# Patient Record
Sex: Female | Born: 1995 | Race: White | Hispanic: No | Marital: Single | Smoking: Former smoker
Health system: Southern US, Community
[De-identification: ages and names within clinical notes are randomized; demographics above are authoritative.]

## PROBLEM LIST (undated history)

## (undated) DIAGNOSIS — Z789 Other specified health status: Secondary | ICD-10-CM

## (undated) HISTORY — DX: Other specified health status: Z78.9

---
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
## 2013-04-06 ENCOUNTER — Encounter: Payer: Self-pay | Admitting: Family Medicine

## 2013-04-06 ENCOUNTER — Ambulatory Visit (INDEPENDENT_AMBULATORY_CARE_PROVIDER_SITE_OTHER): Payer: BC Managed Care – PPO

## 2013-04-06 ENCOUNTER — Ambulatory Visit (INDEPENDENT_AMBULATORY_CARE_PROVIDER_SITE_OTHER): Payer: BC Managed Care – PPO | Admitting: Family Medicine

## 2013-04-06 VITALS — BP 115/71 | HR 85 | Ht 64.0 in | Wt 132.0 lb

## 2013-04-06 DIAGNOSIS — M25561 Pain in right knee: Secondary | ICD-10-CM

## 2013-04-06 DIAGNOSIS — M25569 Pain in unspecified knee: Secondary | ICD-10-CM

## 2013-04-06 DIAGNOSIS — W880XXA Exposure to X-rays, initial encounter: Secondary | ICD-10-CM

## 2013-04-06 MED ORDER — MELOXICAM 15 MG PO TABS: 15.0000 mg | ORAL_TABLET | Freq: Every day | ORAL | Status: DC
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
## 2013-04-06 NOTE — Patient Instructions (Addendum)
We will call you tomorrow about your xray results.  Start meloxicam today and take daily.  Hold onto the exercise regimen we gave you today.

## 2013-04-07 NOTE — Addendum Note (Signed)
Addended by: Avon Gully C on: 04/07/2013 12:04 PM   Modules accepted: Orders

## 2013-04-26 ENCOUNTER — Encounter: Payer: Self-pay | Admitting: Physician Assistant

## 2013-04-26 ENCOUNTER — Ambulatory Visit (INDEPENDENT_AMBULATORY_CARE_PROVIDER_SITE_OTHER): Payer: BC Managed Care – PPO | Admitting: Physician Assistant

## 2013-04-26 VITALS — BP 117/74 | HR 97 | Wt 131.0 lb

## 2013-04-26 DIAGNOSIS — M222X1 Patellofemoral disorders, right knee: Secondary | ICD-10-CM

## 2013-04-26 DIAGNOSIS — R6882 Decreased libido: Secondary | ICD-10-CM

## 2013-04-26 DIAGNOSIS — M25569 Pain in unspecified knee: Secondary | ICD-10-CM

## 2013-04-26 DIAGNOSIS — M25561 Pain in right knee: Secondary | ICD-10-CM | POA: Insufficient documentation

## 2013-04-26 MED ORDER — MELOXICAM 15 MG PO TABS: 15.0000 mg | ORAL_TABLET | Freq: Every day | ORAL | Status: DC

## 2013-04-26 MED ORDER — TRAMADOL HCL 50 MG PO TABS: 50.0000 mg | ORAL_TABLET | Freq: Four times a day (QID) | ORAL | Status: DC | PRN

## 2013-04-26 NOTE — Patient Instructions (Addendum)
Patellofemoral Syndrome  If you have had pain in the front of your knee for a long time, chances are good that you have patellofemoral syndrome. The word patella refers to the kneecap. Femoral (or femur) refers to the thigh bone. That is the bone the kneecap sits on. The kneecap is shaped like a triangle. Its job is to protect the knee and to improve the efficiency of your thigh muscles (quadriceps). The underside of the kneecap is made of smooth tissue (cartilage). This lets the kneecap slide up and down as the knee moves. Sometimes this cartilage becomes soft. Your healthcare provider may say the cartilage breaks down. That is patellofemoral syndrome. It can affect one knee, or both. The condition is sometimes called patellofemoral pain syndrome. That is because the condition is painful. The pain usually gets worse with activity. Sitting for a long time with the knee bent also makes the pain worse. It usually gets better with rest and proper treatment.  CAUSES   No one is sure why some people develop this problem and others do not. Runners often get it. One name for the condition is "runner's knee." However, some people run for years and never have knee pain. Certain things seem to make patellofemoral syndrome more likely. They include:  · Moving out of alignment. The kneecap is supposed to move in a straight line when the thigh muscle pulls on it. Sometimes the kneecap moves in poor alignment. That can make the knee swell and hurt. Some experts believe it also wears down the cartilage.  · Injury to the kneecap.  · Strain on the knee. This may occur during sports activity. Soccer, running, skiing and cycling can put excess stress on the knee.  · Being flat-footed or knock-kneed.  SYMPTOMS   · Knee pain.  · Pain under the kneecap. This is usually a dull, aching pain.  · Pain in the knee when doing certain things: squatting, kneeling, going up or down stairs.  · Pain in the knee when you stand up after sitting down  for awhile.  · Tightness in the knee.  · Loss of muscle strength in the thigh.  · Swelling of the knee.  DIAGNOSIS   Healthcare providers often send people with knee pain to an orthopedic caregiver. This person has special training to treat problems with bones and joints. To decide what is causing your knee pain, your caregiver will probably:  · Do a physical exam. This will probably include:  · Asking about symptoms you have noticed.  · Asking about your activities and any injuries.  · Feeling your knee. Moving it. This will help test the knee's strength. It will also check alignment (whether the knee and leg are aligned normally).  · Order some tests, such as:  · Imaging tests. They create pictures of the inside of the knee. Tests may include:  · X-rays.  · Computed tomography (CT) scan. This uses X-rays and a computer to show more detail.  · Magnetic resonance imaging (MRI). This test uses magnets, radio waves and a computer to make pictures.  TREATMENT   · Medication is almost always used first. It can relieve pain. It also can reduce swelling. Non-steroidal anti-inflammatory medicines (called NSAIDs) are usually suggested. Sometimes a stronger form is needed. A stronger form would require a prescription.  · Other treatment may be needed after the swelling goes down. Possibilities include:  · Exercise. Certain exercises can make the muscles around the knee stronger which decreases the   pressure on the knee cap. This includes the thigh muscle. Certain exercises also may be suggested to increase your flexibility.  · A knee brace. This gives the knee extra support and helps align the movement of the knee cap.  · Orthotics. These are special shoe inserts. They can help keep your leg and knee aligned.  · Surgery is sometimes needed. This is rare. Options include:  · Arthroscopy. The surgeon uses a special tool to remove any damaged pieces of the kneecap. Only a few small incisions (cuts) are needed.  · Realignment.  This is open surgery. The goals are to reduce pressure and fix the way the kneecap moves.  HOME CARE INSTRUCTIONS   · Take any medication prescribed by your healthcare provider. Follow the directions carefully.  · If your knee is swollen:  · Put ice or cold packs on it. Do this for 20 to 30 minutes, 3 to 4 times a day.  · Keep the knee raised. Make sure it is supported. Put a pillow under it.  · Rest your knee. For example, take the elevator instead of the stairs for awhile. Or, take a break from sports activity that strain your knee. Try walking or swimming instead.  · Whenever you are active:  · Use an elastic bandage on your knee. This gives it support.  · After any activity, put ice or cold packs on your knees. Do this for about 10 to 20 minutes.  · Make sure you wear shoes that give good support. Make sure they are not worn down. The heels should not slant in or out.  SEEK MEDICAL CARE IF:   · Knee pain gets worse. Or it does not go away, even after taking pain medicine.  · Swelling does not go down.  · Your thigh muscle becomes weak.  · You have an oral temperature above 102° F (38.9° C).  SEEK IMMEDIATE MEDICAL CARE IF:   You have an oral temperature above 102° F (38.9° C), not controlled by medicine.  Document Released: 05/20/2009 Document Revised: 08/24/2011 Document Reviewed: 05/20/2009  ExitCare® Patient Information ©2014 [employer].

## 2013-04-26 NOTE — Progress Notes (Signed)
  Subjective:    Patient ID: Meghan Moreno, female    DOB: January 08, 1996, 17 y.o.   MRN: [id number]  HPI Patient presents to the clinic with ongoing bilateral knee pain. She has had knee pain for many years but had become much worse over the past 4 weeks. Pain is only when walking or running for extended periods of time. Worst when going up and down stairs. Pain is 9/10 when running. Best if seated or walking for short distances. Pain is localized in the anterior knee. Xrays were done by Dr. Ivan Anchors and were negative. Mobic was given and does not seem to really help. Tramadol helps some.    Pt's boyfriend is concerned with her libido. He wants to have sex every day and she only wants to have sex a couple times a week. She is still able to orgasm. She admits to a lot of school work and stress. She tries to exercise but her knee hurt a lot.    Review of Systems     Objective:   Physical Exam  Constitutional: She is oriented to person, place, and time. She appears well-developed and well-nourished.  HENT:  Head: Normocephalic and atraumatic.  Cardiovascular: Normal rate, regular rhythm and normal heart sounds.   Pulmonary/Chest: Effort normal and breath sounds normal.  Musculoskeletal:  Normal ROM of bilateral knees. No joint tenderness to palpation. No effusion or swelling. Strength 5/5.   Neurological: She is alert and oriented to person, place, and time. She has normal reflexes.  Skin: Skin is warm and dry.  Psychiatric: She has a normal mood and affect. Her behavior is normal.          Assessment & Plan:  Bilateral knee pain- symptoms seem consistent with patellofemoral syndrome. I am sending her to Dr. Karie Schwalbe for evaluation and possible orthotics. Xrays normal. Could consider MRI if not improving. Continue to take mobic and as needed tramadol.   Loss of libido- discussed with patient I feel her libido is normal but perhaps she needs to have a discussion with her boyfriend about  expectations. As long as she continues to orgasm and want sex weekly she is fine. She is not on any medications that could effect. Stress and anxiety could affect libido. Consider exercise to help decrease stress and increase libido.    Spent 30 minutes with patient and greater than 50 percent of visit spent counseling patient regarding libido.

## 2013-04-28 DIAGNOSIS — M222X1 Patellofemoral disorders, right knee: Secondary | ICD-10-CM | POA: Insufficient documentation

## 2013-04-28 MED ORDER — BECLOMETHASONE DIPROPIONATE 40 MCG/ACT IN AERS: 2.0000 | INHALATION_SPRAY | Freq: Two times a day (BID) | RESPIRATORY_TRACT | Status: DC

## 2013-04-28 MED ORDER — PREDNISONE 50 MG PO TABS: ORAL_TABLET | ORAL | Status: DC

## 2013-07-19 ENCOUNTER — Encounter: Payer: Self-pay | Admitting: Family Medicine

## 2013-07-19 ENCOUNTER — Ambulatory Visit (INDEPENDENT_AMBULATORY_CARE_PROVIDER_SITE_OTHER): Payer: BC Managed Care – PPO | Admitting: Family Medicine

## 2013-07-19 VITALS — BP 114/72 | HR 104 | Temp 98.2°F | Wt 125.0 lb

## 2013-07-19 DIAGNOSIS — A088 Other specified intestinal infections: Secondary | ICD-10-CM

## 2013-07-19 DIAGNOSIS — A084 Viral intestinal infection, unspecified: Secondary | ICD-10-CM

## 2013-07-19 MED ORDER — ONDANSETRON HCL 4 MG PO TABS: 4.0000 mg | ORAL_TABLET | Freq: Three times a day (TID) | ORAL | Status: DC | PRN

## 2013-07-19 MED ORDER — PROMETHAZINE HCL 50 MG PO TABS: 50.0000 mg | ORAL_TABLET | Freq: Four times a day (QID) | ORAL | Status: DC | PRN

## 2013-07-19 NOTE — Patient Instructions (Signed)
Diet for Diarrhea, Adult  Frequent, runny stools (diarrhea) may be caused or worsened by food or drink. Diarrhea may be relieved by changing your diet. Since diarrhea can last up to 7 days, it is easy for you to lose too much fluid from the body and become dehydrated. Fluids that are lost need to be replaced. Along with a modified diet, make sure you drink enough fluids to keep your urine clear or pale yellow.  DIET INSTRUCTIONS  · Ensure adequate fluid intake (hydration): have 1 cup (8 oz) of fluid for each diarrhea episode. Avoid fluids that contain simple sugars or sports drinks, fruit juices, whole milk products, and sodas. Your urine should be clear or pale yellow if you are drinking enough fluids. Hydrate with an oral rehydration solution that you can purchase at pharmacies, retail stores, and online. You can prepare an oral rehydration solution at home by mixing the following ingredients together:  ·   tsp table salt.  · ¾ tsp baking soda.  ·  tsp salt substitute containing potassium chloride.  · 1  tablespoons sugar.  · 1 L (34 oz) of water.  · Certain foods and beverages may increase the speed at which food moves through the gastrointestinal (GI) tract. These foods and beverages should be avoided and include:  · Caffeinated and alcoholic beverages.  · High-fiber foods, such as raw fruits and vegetables, nuts, seeds, and whole grain breads and cereals.  · Foods and beverages sweetened with sugar alcohols, such as xylitol, sorbitol, and mannitol.  · Some foods may be well tolerated and may help thicken stool including:  · Starchy foods, such as rice, toast, pasta, low-sugar cereal, oatmeal, grits, baked potatoes, crackers, and bagels.    · Bananas.    · Applesauce.  · Add probiotic-rich foods to help increase healthy bacteria in the GI tract, such as yogurt and fermented milk products.  RECOMMENDED FOODS AND BEVERAGES  Starches  Choose foods with less than 2 g of fiber per serving.  · Recommended:  White,  French, and pita breads, plain rolls, buns, bagels. Plain muffins, matzo. Soda, saltine, or graham crackers. Pretzels, melba toast, zwieback. Cooked cereals made with water: cornmeal, farina, cream cereals. Dry cereals: refined corn, wheat, rice. Potatoes prepared any way without skins, refined macaroni, spaghetti, noodles, refined rice.  · Avoid:  Bread, rolls, or crackers made with whole wheat, multi-grains, rye, bran seeds, nuts, or coconut. Corn tortillas or taco shells. Cereals containing whole grains, multi-grains, bran, coconut, nuts, raisins. Cooked or dry oatmeal. Coarse wheat cereals, granola. Cereals advertised as "high-fiber." Potato skins. Whole grain pasta, wild or brown rice. Popcorn. Sweet potatoes, yams. Sweet rolls, doughnuts, waffles, pancakes, sweet breads.  Vegetables  · Recommended: Strained tomato and vegetable juices. Most well-cooked and canned vegetables without seeds. Fresh: Tender lettuce, cucumber without the skin, cabbage, spinach, bean sprouts.  · Avoid: Fresh, cooked, or canned: Artichokes, baked beans, beet greens, broccoli, Brussels sprouts, corn, kale, legumes, peas, sweet potatoes. Cooked: Green or red cabbage, spinach. Avoid large servings of any vegetables because vegetables shrink when cooked, and they contain more fiber per serving than fresh vegetables.  Fruit  · Recommended: Cooked or canned: Apricots, applesauce, cantaloupe, cherries, fruit cocktail, grapefruit, grapes, kiwi, mandarin oranges, peaches, pears, plums, watermelon. Fresh: Apples without skin, ripe banana, grapes, cantaloupe, cherries, grapefruit, peaches, oranges, plums. Keep servings limited to ½ cup or 1 piece.  · Avoid: Fresh: Apples with skin, apricots, mangoes, pears, raspberries, strawberries. Prune juice, stewed or dried prunes. Dried   fruits, raisins, dates. Large servings of all fresh fruits.  Protein  · Recommended: Ground or well-cooked tender beef, ham, veal, lamb, pork, or poultry. Eggs. Fish,  oysters, shrimp, lobster, other seafoods. Liver, organ meats.  · Avoid: Tough, fibrous meats with gristle. Peanut butter, smooth or chunky. Cheese, nuts, seeds, legumes, dried peas, beans, lentils.  Dairy  · Recommended: Yogurt, lactose-free milk, kefir, drinkable yogurt, buttermilk, soy milk, or plain hard cheese.  · Avoid: Milk, chocolate milk, beverages made with milk, such as milkshakes.  Soups  · Recommended: Bouillon, broth, or soups made from allowed foods. Any strained soup.  · Avoid: Soups made from vegetables that are not allowed, cream or milk-based soups.  Desserts and Sweets  · Recommended: Sugar-free gelatin, sugar-free frozen ice pops made without sugar alcohol.  · Avoid: Plain cakes and cookies, pie made with fruit, pudding, custard, cream pie. Gelatin, fruit, ice, sherbet, frozen ice pops. Ice cream, ice milk without nuts. Plain hard candy, honey, jelly, molasses, syrup, sugar, chocolate syrup, gumdrops, marshmallows.  Fats and Oils  · Recommended: Limit fats to less than 8 tsp per day.  · Avoid: Seeds, nuts, olives, avocados. Margarine, butter, cream, mayonnaise, salad oils, plain salad dressings. Plain gravy, crisp bacon without rind.  Beverages  · Recommended: Water, decaffeinated teas, oral rehydration solutions, sugar-free beverages not sweetened with sugar alcohols.  · Avoid: Fruit juices, caffeinated beverages (coffee, tea, soda), alcohol, sports drinks, or lemon-lime soda.  Condiments  · Recommended: Ketchup, mustard, horseradish, vinegar, cocoa powder. Spices in moderation: allspice, basil, bay leaves, celery powder or leaves, cinnamon, cumin powder, curry powder, ginger, mace, marjoram, onion or garlic powder, oregano, paprika, parsley flakes, ground pepper, rosemary, sage, savory, tarragon, thyme, turmeric.  · Avoid: Coconut, honey.  Document Released: 08/22/2003 Document Revised: 02/24/2012 Document Reviewed: 10/16/2011  [employer]® Patient Information ©2014 [employer].

## 2013-07-19 NOTE — Progress Notes (Signed)
CC: Meghan Moreno is a 18 y.o. female is here for Emesis and Diarrhea   Subjective: HPI:  Complaints of nausea, vomiting, diarrhea that began a little over 24 hours ago at 4 AM. Start with nausea and loose stools progressed within 45 hours to include nausea of nonbloody non-coffee ground emesis. She's unsure how may passages she had yesterday but believes that she has vomited at least 5 times and had 4 bowel movements since she woke this morning. Symptoms are worse when trying to ingest foods, this is included Gatorade and crackers. She reports fatigue. She reports her stomach feels unsettled but denies pain in the abdomen specifically. Her unsettled feeling does not radiate. Symptoms are present all hours of the day. Nothing has made it better although she has tried Pepto-Bismol.  Denies fevers, chills, genitourinary complaints, chest pain, shortness of breath, rashes, motor sensory disturbances  Fianc had identical symptoms to a mild degree earlier this week now resolved   Review Of Systems Outlined In HPI  History reviewed. No pertinent past medical history.  History reviewed. No pertinent past surgical history. Family History  Problem Relation Age of Onset  . Leukemia    . Diabetes    . Stroke      grandparent    History   Social History  . Marital Status: Single    Spouse Name: N/A    Number of Children: N/A  . Years of Education: N/A   Occupational History  . Not on file.   Social History Main Topics  . Smoking status: Never Smoker   . Smokeless tobacco: Not on file  . Alcohol Use: No  . Drug Use: Not on file  . Sexual Activity: Yes    Birth Control/ Protection: None   Other Topics Concern  . Not on file   Social History Narrative  . No narrative on file     Objective: BP 114/72  Pulse 104  Temp(Src) 98.2 F (36.8 C) (Oral)  Wt 125 lb (56.7 kg)  General: Alert and Oriented, No Acute Distress HEENT: Pupils equal, round, reactive to light.  Conjunctivae clear.  External ears unremarkable, canals clear with intact TMs with appropriate landmarks.  Middle ear appears open without effusion. Pink inferior turbinates.  Moist mucous membranes, pharynx without inflammation nor lesions.  Neck supple without palpable lymphadenopathy nor abnormal masses. Lungs: Clear to auscultation bilaterally, no wheezing/ronchi/rales.  Comfortable work of breathing. Good air movement. Cardiac: Regular rate and rhythm. Normal S1/S2.  No murmurs, rubs, nor gallops.   Abdomen: Normal bowel sounds, mild discomfort in the left and right lower quadrants with deep palpation however there is no rebound, rigidity, nor palpable masses. No guarding. Extremities: No peripheral edema.  Strong peripheral pulses.  Mental Status: No depression, anxiety, nor agitation. Skin: Warm and dry.  Assessment & Plan: Treana was seen today for emesis and diarrhea.  Diagnoses and associated orders for this visit:  Viral gastroenteritis  Other Orders - ondansetron (ZOFRAN) 4 MG tablet; Take 1-2 tablets (4-8 mg total) by mouth every 8 (eight) hours as needed for nausea or vomiting. - promethazine (PHENERGAN) 50 MG tablet; Take 1 tablet (50 mg total) by mouth every 6 (six) hours as needed for nausea or vomiting.    High suspicion for viral gastroenteritis she was given 8 mg of Zofran today in the office, if this helps nausea within the next half an hour then fill prescription for Zofran otherwise only fill prescription for promethazine.Signs and symptoms requring emergent/urgent reevaluation were  discussed with the patient. Focus of breath diet starting with clear liquids today and tomorrow  Return if symptoms worsen or fail to improve.

## 2013-07-27 ENCOUNTER — Ambulatory Visit (INDEPENDENT_AMBULATORY_CARE_PROVIDER_SITE_OTHER): Payer: BC Managed Care – PPO | Admitting: Family Medicine

## 2013-07-27 ENCOUNTER — Encounter: Payer: Self-pay | Admitting: Family Medicine

## 2013-07-27 ENCOUNTER — Telehealth: Payer: Self-pay | Admitting: *Deleted

## 2013-07-27 VITALS — BP 114/76 | HR 100 | Temp 98.1°F | Wt 129.0 lb

## 2013-07-27 DIAGNOSIS — J029 Acute pharyngitis, unspecified: Secondary | ICD-10-CM

## 2013-07-27 DIAGNOSIS — J020 Streptococcal pharyngitis: Secondary | ICD-10-CM

## 2013-07-27 LAB — POCT RAPID STREP A (OFFICE): RAPID STREP A SCREEN: NEGATIVE

## 2013-07-27 MED ORDER — PENICILLIN V POTASSIUM 500 MG PO TABS: ORAL_TABLET | ORAL | Status: AC

## 2013-07-27 MED ORDER — LIDOCAINE VISCOUS 2 % MT SOLN: 20.0000 mL | OROMUCOSAL | Status: DC | PRN

## 2013-07-27 NOTE — Telephone Encounter (Signed)
Pt called and asked for a letter for work on sat because pharm will not have her rx until tomorrow. Letter written and place up front.pt notified

## 2013-07-27 NOTE — Progress Notes (Signed)
CC: Meghan Moreno is a 18 y.o. female is here for Sore Throat   Subjective: HPI:  Complains of sore throat for the past 24 hours accompanied by fatigue without any other symptoms. Pain is worse with swallowing described only as soreness mild to moderate severity localized in the back of the throat nonradiating. Slightly improved with honey, no other interventions. Nothing else makes better or worse. Has been persistent since onset. Denies cough, wheezing, shortness of breath, difficulty swallowing, rash.   Review Of Systems Outlined In HPI  No past medical history on file.  No past surgical history on file. Family History  Problem Relation Age of Onset  . Leukemia    . Diabetes    . Stroke      grandparent    History   Social History  . Marital Status: Single    Spouse Name: N/A    Number of Children: N/A  . Years of Education: N/A   Occupational History  . Not on file.   Social History Main Topics  . Smoking status: Never Smoker   . Smokeless tobacco: Not on file  . Alcohol Use: No  . Drug Use: Not on file  . Sexual Activity: Yes    Birth Control/ Protection: None   Other Topics Concern  . Not on file   Social History Narrative  . No narrative on file     Objective: BP 114/76  Pulse 100  Temp(Src) 98.1 F (36.7 C) (Oral)  Wt 129 lb (58.514 kg)  General: Alert and Oriented, No Acute Distress HEENT: Pupils equal, round, reactive to light. Conjunctivae clear.  External ears unremarkable, canals clear with intact TMs with appropriate landmarks.  Middle ear appears open without effusion. Pink inferior turbinates.  Moist mucous membranes, midline uvula with moderate pharyngeal erythema and mild bilateral tonsillar exudates.  Neck supple without palpable lymphadenopathy nor abnormal masses. Lungs: Clear to auscultation bilaterally, no wheezing/ronchi/rales.  Comfortable work of breathing. Good air movement. Mental Status: No depression, anxiety, nor  agitation. Skin: Warm and dry.  Assessment & Plan: [name] was seen today for sore throat.  Diagnoses and associated orders for this visit:  Acute pharyngitis - POCT rapid strep A  Strep pharyngitis - penicillin v potassium (VEETID) 500 MG tablet; One by mouth every 12 hours for ten days, take 1 hour before or 2 hours after meals. - lidocaine (XYLOCAINE) 2 % solution; Use as directed 20 mLs in the mouth or throat as needed for mouth pain. Gargle 10-20 seconds and spit or swallow every 4 hours as needed    Strep pharyngitis: Start penicillin and as needed lidocaine encouraged to use ibuprofen 800 mg every 8 hours as needed for pain   Return if symptoms worsen or fail to improve.

## 2013-09-01 ENCOUNTER — Encounter: Payer: Self-pay | Admitting: Family Medicine

## 2013-09-01 ENCOUNTER — Ambulatory Visit (INDEPENDENT_AMBULATORY_CARE_PROVIDER_SITE_OTHER): Payer: BC Managed Care – PPO | Admitting: Family Medicine

## 2013-09-01 VITALS — BP 106/58 | HR 82 | Temp 98.2°F | Wt 130.0 lb

## 2013-09-01 DIAGNOSIS — H6690 Otitis media, unspecified, unspecified ear: Secondary | ICD-10-CM

## 2013-09-01 MED ORDER — DOXYCYCLINE HYCLATE 100 MG PO TABS
ORAL_TABLET | ORAL | Status: AC
Start: 2013-09-01 — End: 2013-09-11

## 2013-09-01 NOTE — Progress Notes (Signed)
CC: Meghan Moreno is a 18 y.o. female is here for left ear sounds muffled   Subjective: HPI:  Complains of decreased hearing in left ear described as muffled, mild in severity, present for exactly one week. Came on abruptly has not been getting better or worse since onset.  Has not been accompanied by any other symptoms. Interventions have included over-the-counter ear drops which have not helped. Symptoms are present all hours of the day. Denies fevers, chills, pain, drainage from ear, nasal congestion, sore throat, nor any other respiratory complaints. Denies pain, tinnitus, nor dizziness   Review Of Systems Outlined In HPI  No past medical history on file.  No past surgical history on file. Family History  Problem Relation Age of Onset  . Leukemia    . Diabetes    . Stroke      grandparent    History   Social History  . Marital Status: Single    Spouse Name: N/A    Number of Children: N/A  . Years of Education: N/A   Occupational History  . Not on file.   Social History Main Topics  . Smoking status: Never Smoker   . Smokeless tobacco: Not on file  . Alcohol Use: No  . Drug Use: Not on file  . Sexual Activity: Yes    Birth Control/ Protection: None   Other Topics Concern  . Not on file   Social History Narrative  . No narrative on file     Objective: BP 106/58  Pulse 82  Temp(Src) 98.2 F (36.8 C) (Oral)  Wt 130 lb (58.968 kg)  General: Alert and Oriented, No Acute Distress HEENT: Pupils equal, round, reactive to light. Conjunctivae clear.  External ears unremarkable, canals clear with intact TMs with appropriate landmarks.  Right Middle ear appears open without effusion left middle ear with purulent effusion. Pink inferior turbinates.  Moist mucous membranes, pharynx without inflammation nor lesions.  Neck supple without palpable lymphadenopathy nor abnormal masses. Neuro: Cranial nerves II through XII grossly intact Mental Status: No depression,  anxiety, nor agitation. Skin: Warm and dry.  Tympanogram is flat, Rinne test is normal weber shows no lateralization  Bedside audiometry shows hearing loss to 40 dB across all 4 frequencies  Assessment & Plan: Meghan Moreno was seen today for left ear sounds muffled.  Diagnoses and associated orders for this visit:  Otitis media  Other Orders - doxycycline (VIBRA-TABS) 100 MG tablet; One by mouth twice a day for ten days.    Start doxycycline if hearing is not improved by Wednesday call and I will refer for formal audiology,   Return if symptoms worsen or fail to improve.

## 2013-10-27 ENCOUNTER — Ambulatory Visit (INDEPENDENT_AMBULATORY_CARE_PROVIDER_SITE_OTHER): Payer: BC Managed Care – PPO | Admitting: Family Medicine

## 2013-10-27 ENCOUNTER — Encounter: Payer: Self-pay | Admitting: Family Medicine

## 2013-10-27 VITALS — BP 111/72 | HR 88 | Temp 98.9°F | Wt 130.0 lb

## 2013-10-27 DIAGNOSIS — J029 Acute pharyngitis, unspecified: Secondary | ICD-10-CM

## 2013-10-27 MED ORDER — AMOXICILLIN 500 MG PO CAPS: 500.0000 mg | ORAL_CAPSULE | Freq: Three times a day (TID) | ORAL | Status: DC

## 2013-10-27 NOTE — Progress Notes (Signed)
CC: Meghan Moreno is a 18 y.o. female is here for Nasal Congestion   Subjective: HPI:  Patient complains of nasal congestion sore throat that has been present for the past week on a daily basis mild to moderate severity, present all hours of the day. Pain is localized low in the throat present only when swallowing. Symptoms are improved with Mucinex however only mildly. No other interventions. Have not been getting better or worse overall since onset. Denies fevers, chills, facial pain, difficulty swallowing, choking, productive cough, shortness of breath, wheezing, nor swollen lymph nodes   Review Of Systems Outlined In HPI  No past medical history on file.  No past surgical history on file. Family History  Problem Relation Age of Onset  . Leukemia    . Diabetes    . Stroke      grandparent    History   Social History  . Marital Status: Single    Spouse Name: N/A    Number of Children: N/A  . Years of Education: N/A   Occupational History  . Not on file.   Social History Main Topics  . Smoking status: Never Smoker   . Smokeless tobacco: Not on file  . Alcohol Use: No  . Drug Use: Not on file  . Sexual Activity: Yes    Birth Control/ Protection: None   Other Topics Concern  . Not on file   Social History Narrative  . No narrative on file     Objective: BP 111/72  Pulse 88  Temp(Src) 98.9 F (37.2 C) (Oral)  Wt 130 lb (58.968 kg)  General: Alert and Oriented, No Acute Distress HEENT: Pupils equal, round, reactive to light. Conjunctivae clear.  External ears unremarkable, canals clear with intact TMs with appropriate landmarks.  Middle ear appears open without effusion. Pink inferior turbinates.  Moist mucous membranes, pharynx without inflammation nor lesions.  Neck supple without palpable lymphadenopathy nor abnormal masses. Lungs: Clear to auscultation bilaterally, no wheezing/ronchi/rales.  Comfortable work of breathing. Good air movement. Extremities:  No peripheral edema.  Strong peripheral pulses.  Mental Status: No depression, anxiety, nor agitation. Skin: Warm and dry.  Assessment & Plan: Meghan PuntMiranda was seen today for nasal congestion.  Diagnoses and associated orders for this visit:  Sore throat - amoxicillin (AMOXIL) 500 MG capsule; Take 1 capsule (500 mg total) by mouth 3 (three) times daily.    Discussed with patient this is most likely due to seasonal allergies, start Qnasl sample that was provided with instructions of using 2 sprays each nostril on a daily basis. If no improvement after the weekend start amoxicillin for next etiology of the differential being bacterial sinusitis  Return if symptoms worsen or fail to improve.

## 2014-03-12 ENCOUNTER — Other Ambulatory Visit: Payer: Self-pay | Admitting: Physician Assistant

## 2014-03-12 DIAGNOSIS — Z319 Encounter for procreative management, unspecified: Secondary | ICD-10-CM

## 2014-04-17 ENCOUNTER — Ambulatory Visit (INDEPENDENT_AMBULATORY_CARE_PROVIDER_SITE_OTHER): Payer: BC Managed Care – PPO | Admitting: Family Medicine

## 2014-04-17 ENCOUNTER — Encounter: Payer: Self-pay | Admitting: Family Medicine

## 2014-04-17 VITALS — BP 120/63 | HR 99 | Wt 137.0 lb

## 2014-04-17 DIAGNOSIS — R079 Chest pain, unspecified: Secondary | ICD-10-CM | POA: Diagnosis not present

## 2014-04-17 DIAGNOSIS — G4700 Insomnia, unspecified: Secondary | ICD-10-CM | POA: Diagnosis not present

## 2014-04-17 MED ORDER — TRAZODONE HCL 150 MG PO TABS: 75.0000 mg | ORAL_TABLET | Freq: Every day | ORAL | Status: DC

## 2014-04-17 NOTE — Progress Notes (Signed)
CC: Meghan Moreno is a 18 y.o. female is here for Chest Pain   Subjective: HPI:  Complains of chest pain localized to the left sternum and also on the left lower rib cage. Symptoms have been present about 3 times a week for the past 1-2 months. Symptoms are not reproducible or predictable. They can occur anytime of the day and come on without warning and will go away without any particular intervention. They will last anywhere from 5 minutes-1 hour. Nothing seems to make it better or worse. They've not changed in character frequency or severity since their onset. She denies any recent or remote trauma/overexertion.  There is no exertional component to her pain she denies any new shortness of breath or cough. There've been no fevers, chills nor wheezing.  She complains of difficult falling asleep over the past 2 years that has now become a moderate in severity on a daily basis. She goes to bed at 10:00 every night but will not be able tosleep until 2 hours after going to bed. Interventions have included diphenhydramine and melatonin without much benefit from either. She denies caffeine intake after 12 noon nor any medications other than above. She denies anxiety, pain or any mental disturbance at keeping her awake at night   Review Of Systems Outlined In HPI  No past medical history on file.  No past surgical history on file. Family History  Problem Relation Age of Onset  . Leukemia    . Diabetes    . Stroke      grandparent    History   Social History  . Marital Status: Single    Spouse Name: N/A    Number of Children: N/A  . Years of Education: N/A   Occupational History  . Not on file.   Social History Main Topics  . Smoking status: Never Smoker   . Smokeless tobacco: Not on file  . Alcohol Use: No  . Drug Use: Not on file  . Sexual Activity: Yes    Birth Control/ Protection: None   Other Topics Concern  . Not on file   Social History Narrative      Objective: BP 120/63 mmHg  Pulse 99  Wt 137 lb (62.143 kg)  General: Alert and Oriented, No Acute Distress HEENT: Pupils equal, round, reactive to light. Conjunctivae clear.  Moist mucous membranes pharynxunremarkable Lungs: Clear to auscultation bilaterally, no wheezing/ronchi/rales.  Comfortable work of breathing. Good air movement. Cardiac: Regular rate and rhythm. Normal S1/S2.  No murmurs, rubs, nor gallops.   Extremities: No peripheral edema.  Strong peripheral pulses.  Mental Status: No depression, anxiety, nor agitation. Skin: Warm and dry.  Assessment & Plan: Meghan PuntMiranda was seen today for chest pain.  Diagnoses and associated orders for this visit:  Insomnia - traZODone (DESYREL) 150 MG tablet; Take 0.5-1 tablets (75-150 mg total) by mouth at bedtime.  Chest pain, unspecified chest pain type - EKG 12-Lead    Insomnia: Start trazodone Chest pain: EKG was obtained showing normal sinus rhythm with mild PR interval shortening, no pathologic Q waves, no ST segment elevation or depression, no other conduction abnormality other than that described above. Reassurance was provided this sounds like costochondritis. She believes ignoring the pain would be easier than taking a nonsteroidal anti-inflammatory medication.  She can follow-up on an as-needed basis for this  No Follow-up on file.

## 2014-08-08 ENCOUNTER — Emergency Department
Admission: EM | Admit: 2014-08-08 | Discharge: 2014-08-08 | Disposition: A | Discharge status: Home / Self Care | Payer: BLUE CROSS/BLUE SHIELD | Source: Home / Self Care | Attending: Family Medicine | Admitting: Family Medicine

## 2014-08-08 ENCOUNTER — Encounter: Payer: Self-pay | Admitting: Emergency Medicine

## 2014-08-08 DIAGNOSIS — S39012A Strain of muscle, fascia and tendon of lower back, initial encounter: Secondary | ICD-10-CM

## 2014-08-08 MED ORDER — MELOXICAM 15 MG PO TABS: 15.0000 mg | ORAL_TABLET | Freq: Every day | ORAL | Status: DC

## 2014-08-08 NOTE — ED Provider Notes (Signed)
CSN: [id number]     Arrival date & time 08/08/14  1223 History   First MD Initiated Contact with Patient 08/08/14 1244     Chief Complaint  Patient presents with  . Back Pain     HPI Comments: Patient was lifting a heavy tray of dishes at work one week ago.  Afterwards she felt nonradiating pain in her lower back.  The pain has improved somewhat but was worse yesterday.  She denies bowel or bladder dysfunction, and no saddle numbness.  The pain is worse when bending over and lifting, and does not affect her sleep.    Patient is a 20 y.o. female presenting with back pain. The history is provided by the patient.  Back Pain Location:  Lumbar spine Quality:  Aching Radiates to:  Does not radiate Pain severity:  Mild Pain is:  Worse during the day Onset quality:  Sudden Duration:  1 week Timing:  Intermittent Progression:  Unchanged Chronicity:  New Context: lifting heavy objects   Relieved by:  NSAIDs Worsened by:  Bending Ineffective treatments:  None tried Associated symptoms: no abdominal pain, no bladder incontinence, no bowel incontinence, no dysuria, no fever, no leg pain, no numbness, no paresthesias, no perianal numbness and no weakness     History reviewed. No pertinent past medical history. History reviewed. No pertinent past surgical history. Family History  Problem Relation Age of Onset  . Leukemia    . Diabetes    . Stroke      grandparent  . Diabetes Father    History  Substance Use Topics  . Smoking status: Never Smoker   . Smokeless tobacco: Not on file  . Alcohol Use: No   OB History    No data available     Review of Systems  Constitutional: Negative for fever.  Gastrointestinal: Negative for abdominal pain and bowel incontinence.  Genitourinary: Negative for bladder incontinence and dysuria.  Musculoskeletal: Positive for back pain.  Neurological: Negative for weakness, numbness and paresthesias.  All other systems reviewed and are  negative.   Allergies  Review of patient's allergies indicates no active allergies.  Home Medications   Prior to Admission medications   Medication Sig Start Date End Date Taking? Authorizing Provider  meloxicam (MOBIC) 15 MG tablet Take 1 tablet (15 mg total) by mouth daily. Take with food each morning 08/08/14   Lattie Haw, MD  traZODone (DESYREL) 150 MG tablet Take 0.5-1 tablets (75-150 mg total) by mouth at bedtime. 04/17/14   Sean Hommel, DO   BP 117/80 mmHg  Pulse 85  Temp(Src) 98.4 F (36.9 C) (Oral)  Ht  (1.6 m)  Wt 130 lb (58.968 kg)  BMI 23.03 kg/m2  SpO2 100%  LMP 08/07/2014 Physical Exam  Constitutional: She is oriented to person, place, and time. She appears well-developed and well-nourished. No distress.  HENT:  Head: Normocephalic.  Eyes: Conjunctivae are normal. Pupils are equal, round, and reactive to light.  Neck: Normal range of motion.  Cardiovascular: Normal heart sounds.   Pulmonary/Chest: Breath sounds normal.  Abdominal: There is no tenderness.  Musculoskeletal:       Lumbar back: She exhibits tenderness. She exhibits normal range of motion, no bony tenderness and no swelling.       Back:  Back:  Full range of motion.  Can heel/toe walk and squat without difficulty.  Tenderness in the midline and bilateral paraspinous muscles from L1 to about L5.   Straight leg raising test is negative.  Sitting knee extension test is negative.  Strength and sensation in the lower extremities is normal.  Patellar and achilles reflexes are normal   Neurological: She is alert and oriented to person, place, and time.  Skin: Skin is warm and dry. No rash noted.  Nursing note and vitals reviewed.   ED Course  Procedures  none   MDM   1. Low back strain, initial encounter    Begin Mobic 15mg  daily. Apply ice pack for 20 to 30 minutes, 3 to 4 times daily  Continue until pain decreases.  Avoid heavy lifting if possible.  Begin back exercises as  tolerated. Followup with Dr. Rodney Langtonhomas [facility]kkekand[facility] ([facility] if not improving about two weeks.     Lattie HawStephen A Beese, MD 08/08/14 [phone]

## 2014-08-08 NOTE — ED Notes (Signed)
After lifting heavy tubs filled with dishes at work felt pain in lower back x 1 week

## 2014-08-08 NOTE — Discharge Instructions (Signed)
Apply ice pack for 20 to 30 minutes, 3 to 4 times daily  Continue until pain decreases.  Avoid heavy lifting if possible.  Begin back exercises as tolerated.   Back Pain, Adult Low back pain is very common. About 1 in 5 people have back pain.The cause of low back pain is rarely dangerous. The pain often gets better over time.About half of people with a sudden onset of back pain feel better in just 2 weeks. About 8 in 10 people feel better by 6 weeks.  CAUSES Some common causes of back pain include:  Strain of the muscles or ligaments supporting the spine.  Wear and tear (degeneration) of the spinal discs.  Arthritis.  Direct injury to the back. DIAGNOSIS Most of the time, the direct cause of low back pain is not known.However, back pain can be treated effectively even when the exact cause of the pain is unknown.Answering your caregiver's questions about your overall health and symptoms is one of the most accurate ways to make sure the cause of your pain is not dangerous. If your caregiver needs more information, he or she may order lab work or imaging tests (X-rays or MRIs).However, even if imaging tests show changes in your back, this usually does not require surgery. HOME CARE INSTRUCTIONS For many people, back pain returns.Since low back pain is rarely dangerous, it is often a condition that people can learn to [facility] their own.   Remain active. It is stressful on the back to sit or stand in one place. Do not sit, drive, or stand in one place for more than 30 minutes at a time. Take short walks on level surfaces as soon as pain allows.Try to increase the length of time you walk each day.  Do not stay in bed.Resting more than 1 or 2 days can delay your recovery.  Do not avoid exercise or work.Your body is made to move.It is not dangerous to be active, even though your back may hurt.Your back will likely heal faster if you return to being active before your pain is gone.  Pay  attention to your body when you bend and lift. Many people have less discomfortwhen lifting if they bend their knees, keep the load close to their bodies,and avoid twisting. Often, the most comfortable positions are those that put less stress on your recovering back.  Find a comfortable position to sleep. Use a firm mattress and lie on your side with your knees slightly bent. If you lie on your back, put a pillow under your knees.  Only take over-the-counter or prescription medicines as directed by your caregiver. Over-the-counter medicines to reduce pain and inflammation are often the most helpful.Your caregiver may prescribe muscle relaxant drugs.These medicines help dull your pain so you can more quickly return to your normal activities and healthy exercise.  Put ice on the injured area.  Put ice in a plastic bag.  Place a towel between your skin and the bag.  Leave the ice on for 15-20 minutes, 03-04 times a day for the first 2 to 3 days. After that, ice and heat may be alternated to reduce pain and spasms.  Ask your caregiver about trying back exercises and gentle massage. This may be of some benefit.  Avoid feeling anxious or stressed.Stress increases muscle tension and can worsen back pain.It is important to recognize when you are anxious or stressed and learn ways to manage it.Exercise is a great option. SEEK MEDICAL CARE IF:  You have pain  that is not relieved with rest or medicine.  You have pain that does not improve in 1 week.  You have new symptoms.  You are generally not feeling well. SEEK IMMEDIATE MEDICAL CARE IF:   You have pain that radiates from your back into your legs.  You develop new bowel or bladder control problems.  You have unusual weakness or numbness in your arms or legs.  You develop nausea or vomiting.  You develop abdominal pain.  You feel faint. Document Released: 06/01/2005 Document Revised: 12/01/2011 Document Reviewed:  10/03/2013 [facility] Patient Information 2015 [place], [place]. This information is not intended to replace advice given to you by your health care provider. Make sure you discuss any questions you have with your health care provider.

## 2014-12-14 ENCOUNTER — Ambulatory Visit (INDEPENDENT_AMBULATORY_CARE_PROVIDER_SITE_OTHER): Payer: BLUE CROSS/BLUE SHIELD | Admitting: Family Medicine

## 2014-12-14 ENCOUNTER — Encounter: Payer: Self-pay | Admitting: Family Medicine

## 2014-12-14 VITALS — BP 116/81 | HR 107 | Wt 117.0 lb

## 2014-12-14 DIAGNOSIS — N912 Amenorrhea, unspecified: Secondary | ICD-10-CM | POA: Diagnosis not present

## 2014-12-14 LAB — HCG, QUANTITATIVE, PREGNANCY: hCG, Beta Chain, Quant, S: 64699 m[IU]/mL

## 2014-12-14 NOTE — Progress Notes (Signed)
CC: Meghan Moreno is a 19 y.o. female is here for request preg test   Subjective: HPI:  First day of last menstrual period was sometime in early May. She did not have any vaginal bleeding in June. She started to feel little bit nauseous in the morning. She did a home pregnancy test 2 weeks ago which was positive. She is not sure that she can trust this. She's had unprotected sex without any form of contraception in the month of May. She believes she might be pregnant. She denies any pelvic pain, breast complaints, or abdominal pain. No recent change in exercise routine or dietary changes.  Review Of Systems Outlined In HPI  No past medical history on file.  No past surgical history on file. Family History  Problem Relation Age of Onset  . Leukemia    . Diabetes    . Stroke      grandparent  . Diabetes Father     History   Social History  . Marital Status: Single    Spouse Name: N/A  . Number of Children: N/A  . Years of Education: N/A   Occupational History  . Not on file.   Social History Main Topics  . Smoking status: Never Smoker   . Smokeless tobacco: Not on file  . Alcohol Use: No  . Drug Use: Not on file  . Sexual Activity: Yes    Birth Control/ Protection: None   Other Topics Concern  . Not on file   Social History Narrative     Objective: BP 116/81 mmHg  Pulse 107  Wt 117 lb (53.071 kg)  Vital signs reviewed. General: Alert and Oriented, No Acute Distress HEENT: Pupils equal, round, reactive to light. Conjunctivae clear.  External ears unremarkable.  Moist mucous membranes. Lungs: Clear and comfortable work of breathing, speaking in full sentences without accessory muscle use. Cardiac: Regular rate and rhythm.  Neuro: CN II-XII grossly intact, gait normal. Extremities: No peripheral edema.  Strong peripheral pulses.  Mental Status: No depression, anxiety, nor agitation. Logical though process. Skin: Warm and dry.  Assessment & Plan: Meghan Moreno  was seen today for request preg test.  Diagnoses and all orders for this visit:  Amenorrhea Orders: -     B-HCG Quant  Other orders -     Cancel: POCT Pregnancy, Urine; Standing   Checking beta hCG via blood to give her the most accurate feedback on whether or not she is pregnant. Encouraged to not take any new medications until we get this back.  Return if symptoms worsen or fail to improve.

## 2014-12-28 ENCOUNTER — Ambulatory Visit (INDEPENDENT_AMBULATORY_CARE_PROVIDER_SITE_OTHER): Payer: BLUE CROSS/BLUE SHIELD | Admitting: Advanced Practice Midwife

## 2014-12-28 ENCOUNTER — Encounter: Payer: Self-pay | Admitting: Advanced Practice Midwife

## 2014-12-28 ENCOUNTER — Other Ambulatory Visit: Payer: Self-pay | Admitting: Advanced Practice Midwife

## 2014-12-28 VITALS — BP 107/68 | HR 91 | Wt 121.0 lb

## 2014-12-28 DIAGNOSIS — Z3401 Encounter for supervision of normal first pregnancy, first trimester: Secondary | ICD-10-CM

## 2014-12-28 DIAGNOSIS — Z3400 Encounter for supervision of normal first pregnancy, unspecified trimester: Secondary | ICD-10-CM | POA: Insufficient documentation

## 2014-12-28 DIAGNOSIS — Z3201 Encounter for pregnancy test, result positive: Secondary | ICD-10-CM

## 2014-12-28 NOTE — Progress Notes (Signed)
New OB visit. See Smart set  Subjective:    Meghan Moreno is a G1P0 1867w4d being seen today for her first obstetrical visit.  Her obstetrical history is significant for First Pregnancy. Patient does intend to breast feed. Pregnancy history fully reviewed.  Patient reports no complaints and Had some nausea and vomiting a week or 2 ago, but has stopped.  Filed Vitals:   12/28/14 0924  BP: 107/68  Pulse: 91  Weight: 121 lb (54.885 kg)    HISTORY: OB History  Gravida Para Term Preterm AB SAB TAB Ectopic Multiple Living  1             # Outcome Date GA Lbr Len/2nd Weight Sex Delivery Anes PTL Lv  1 Current              Past Medical History  Diagnosis Date  . Medical history non-contributory    History reviewed. No pertinent past surgical history. Family History  Problem Relation Age of Onset  . Leukemia Mother   . Stroke Maternal Grandmother     grandparent  . Diabetes Father      Exam    Uterus:  Fundal Height: 8 cm  Pelvic Exam:    Perineum: No Hemorrhoids, Normal Perineum   Vulva: normal   Vagina:  normal mucosa   pH:    Cervix: nulliparous appearance   Adnexa: not evaluated   Bony Pelvis: gynecoid  System: Breast:  normal appearance, no masses or tenderness, Inspection negative, No nipple retraction or dimpling, No axillary or supraclavicular adenopathy, Normal to palpation without dominant masses   Skin: normal coloration and turgor, no rashes    Neurologic: oriented, normal, grossly non-focal   Extremities: normal strength, tone, and muscle mass, no deformities   HEENT neck supple with midline trachea   Mouth/Teeth mucous membranes moist, pharynx normal without lesions   Neck supple and no masses   Cardiovascular: regular rate and rhythm, no murmurs or gallops   Respiratory:  appears well, vitals normal, no respiratory distress, acyanotic, normal RR, ear and throat exam is normal, neck free of mass or lymphadenopathy, chest clear, no wheezing,  crepitations, rhonchi, normal symmetric air entry   Abdomen: soft, non-tender; bowel sounds normal; no masses,  no organomegaly   Urinary: urethral meatus normal      Assessment:    Pregnancy: G1P0 Patient Active Problem List   Diagnosis Date Noted  . Supervision of normal first pregnancy 12/28/2014  . 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 Knee pain, bilateral 04/26/2013        Plan:     Initial labs drawn. Prenatal vitamins. Problem list reviewed and updated. Genetic Screening discussed First Screen: declined.  Ultrasound discussed; fetal survey: Discussed we will order this to be done at 20 weeks.  Follow up in 4 weeks. 50% of 30 min visit spent on counseling and coordination of care. Reviewed how practice works, large number and variety of providers, option of declining students/residents.    Patient entered into [employer] program.which was explained to patient and she accepts. Declined first trimester screening.  Next visit at 12 weeks then at 20 weeks.  Need to order her 20 wk US at next visit   [facility]ILLIAMS,Jauna Raczynski 12/28/2014

## 2014-12-28 NOTE — Patient Instructions (Signed)
First Trimester of Pregnancy The first trimester of pregnancy is from week 1 until the end of week 12 (months 1 through 3). A week after a sperm fertilizes an egg, the egg will implant on the wall of the uterus. This embryo will begin to develop into a baby. Genes from you and your partner are forming the baby. The female genes determine whether the baby is a boy or a girl. At 6-8 weeks, the eyes and face are formed, and the heartbeat can be seen on ultrasound. At the end of 12 weeks, all the baby's organs are formed.  Now that you are pregnant, you will want to do everything you can to have a healthy baby. Two of the most important things are to get good prenatal care and to follow your health care provider's instructions. Prenatal care is all the medical care you receive before the baby's birth. This care will help prevent, find, and treat any problems during the pregnancy and childbirth. BODY CHANGES Your body goes through many changes during pregnancy. The changes vary from woman to woman.   You may gain or lose a couple of pounds at first.  You may feel sick to your stomach (nauseous) and throw up (vomit). If the vomiting is uncontrollable, call your health care provider.  You may tire easily.  You may develop headaches that can be relieved by medicines approved by your health care provider.  You may urinate more often. Painful urination may mean you have a bladder infection.  You may develop heartburn as a result of your pregnancy.  You may develop constipation because certain hormones are causing the muscles that push waste through your intestines to slow down.  You may develop hemorrhoids or swollen, bulging veins (varicose veins).  Your breasts may begin to grow larger and become tender. Your nipples may stick out more, and the tissue that surrounds them (areola) may become darker.  Your gums may bleed and may be sensitive to brushing and flossing.  Dark spots or blotches (chloasma,  mask of pregnancy) may develop on your face. This will likely fade after the baby is born.  Your menstrual periods will stop.  You may have a loss of appetite.  You may develop cravings for certain kinds of food.  You may have changes in your emotions from day to day, such as being excited to be pregnant or being concerned that something may go wrong with the pregnancy and baby.  You may have more vivid and strange dreams.  You may have changes in your hair. These can include thickening of your hair, rapid growth, and changes in texture. Some women also have hair loss during or after pregnancy, or hair that feels dry or thin. Your hair will most likely return to normal after your baby is born. WHAT TO EXPECT AT YOUR PRENATAL VISITS During a routine prenatal visit:  You will be weighed to make sure you and the baby are growing normally.  Your blood pressure will be taken.  Your abdomen will be measured to track your baby's growth.  The fetal heartbeat will be listened to starting around week 10 or 12 of your pregnancy.  Test results from any previous visits will be discussed. Your health care provider may ask you:  How you are feeling.  If you are feeling the baby move.  If you have had any abnormal symptoms, such as leaking fluid, bleeding, severe headaches, or abdominal cramping.  If you have any questions. Other tests   that may be performed during your first trimester include:  Blood tests to find your blood type and to check for the presence of any previous infections. They will also be used to check for low iron levels (anemia) and Rh antibodies. Later in the pregnancy, blood tests for diabetes will be done along with other tests if problems develop.  Urine tests to check for infections, diabetes, or protein in the urine.  An ultrasound to confirm the proper growth and development of the baby.  An amniocentesis to check for possible genetic problems.  Fetal screens for  spina bifida and Down syndrome.  You may need other tests to make sure you and the baby are doing well. HOME CARE INSTRUCTIONS  Medicines  Follow your health care provider's instructions regarding medicine use. Specific medicines may be either safe or unsafe to take during pregnancy.  Take your prenatal vitamins as directed.  If you develop constipation, try taking a stool softener if your health care provider approves. Diet  Eat regular, well-balanced meals. Choose a variety of foods, such as meat or vegetable-based protein, fish, milk and low-fat dairy products, vegetables, fruits, and whole grain breads and cereals. Your health care provider will help you determine the amount of weight gain that is right for you.  Avoid raw meat and uncooked cheese. These carry germs that can cause birth defects in the baby.  Eating four or five small meals rather than three large meals a day may help relieve nausea and vomiting. If you start to feel nauseous, eating a few soda crackers can be helpful. Drinking liquids between meals instead of during meals also seems to help nausea and vomiting.  If you develop constipation, eat more high-fiber foods, such as fresh vegetables or fruit and whole grains. Drink enough fluids to keep your urine clear or pale yellow. Activity and Exercise  Exercise only as directed by your health care provider. Exercising will help you:  Control your weight.  Stay in shape.  Be prepared for labor and delivery.  Experiencing pain or cramping in the lower abdomen or low back is a good sign that you should stop exercising. Check with your health care provider before continuing normal exercises.  Try to avoid standing for long periods of time. Move your legs often if you must stand in one place for a long time.  Avoid heavy lifting.  Wear low-heeled shoes, and practice good posture.  You may continue to have sex unless your health care provider directs you  otherwise. Relief of Pain or Discomfort  Wear a good support bra for breast tenderness.   Take warm sitz baths to soothe any pain or discomfort caused by hemorrhoids. Use hemorrhoid cream if your health care provider approves.   Rest with your legs elevated if you have leg cramps or low back pain.  If you develop varicose veins in your legs, wear support hose. Elevate your feet for 15 minutes, 3-4 times a day. Limit salt in your diet. Prenatal Care  Schedule your prenatal visits by the twelfth week of pregnancy. They are usually scheduled monthly at first, then more often in the last 2 months before delivery.  Write down your questions. Take them to your prenatal visits.  Keep all your prenatal visits as directed by your health care provider. Safety  Wear your seat belt at all times when driving.  Make a list of emergency phone numbers, including numbers for family, friends, the hospital, and police and fire departments. General Tips    Ask your health care provider for a referral to a local prenatal education class. Begin classes no later than at the beginning of month 6 of your pregnancy.  Ask for help if you have counseling or nutritional needs during pregnancy. Your health care provider can offer advice or refer you to specialists for help with various needs.  Do not use hot tubs, steam rooms, or saunas.  Do not douche or use tampons or scented sanitary pads.  Do not cross your legs for long periods of time.  Avoid cat litter boxes and soil used by cats. These carry germs that can cause birth defects in the baby and possibly loss of the fetus by miscarriage or stillbirth.  Avoid all smoking, herbs, alcohol, and medicines not prescribed by your health care provider. Chemicals in these affect the formation and growth of the baby.  Schedule a dentist appointment. At home, brush your teeth with a soft toothbrush and be gentle when you floss. SEEK MEDICAL CARE IF:   You have  dizziness.  You have mild pelvic cramps, pelvic pressure, or nagging pain in the abdominal area.  You have persistent nausea, vomiting, or diarrhea.  You have a bad smelling vaginal discharge.  You have pain with urination.  You notice increased swelling in your face, hands, legs, or ankles. SEEK IMMEDIATE MEDICAL CARE IF:   You have a fever.  You are leaking fluid from your vagina.  You have spotting or bleeding from your vagina.  You have severe abdominal cramping or pain.  You have rapid weight gain or loss.  You vomit blood or material that looks like coffee grounds.  You are exposed to German measles and have never had them.  You are exposed to fifth disease or chickenpox.  You develop a severe headache.  You have shortness of breath.  You have any kind of trauma, such as from a fall or a car accident. Document Released: 05/26/2001 Document Revised: 10/16/2013 Document Reviewed: 04/11/2013 [employer] Patient Information 2015 [employer]. This information is not intended to replace advice given to you by your health care provider. Make sure you discuss any questions you have with your health care provider.  

## 2014-12-28 NOTE — Assessment & Plan Note (Signed)
BABYSCRIPTS PATIENT: [x ] initial, [ ] 12, [ ] 20, [ ] 28, [ ] 32, [ ] 36, [ ] 38, [ ] 39, [ ] 40 

## 2014-12-28 NOTE — Progress Notes (Signed)
NOB intake today.  Pt had a condom to break but is happy with the pregnancy  Bedside U/S showed IUP with CRL of 19.137mm and FHT 173 BPM

## 2014-12-29 LAB — GC/CHLAMYDIA PROBE AMP
CT Probe RNA: NEGATIVE
GC PROBE AMP APTIMA: NEGATIVE

## 2014-12-29 LAB — SICKLE CELL SCREEN: SICKLE CELL SCREEN: NEGATIVE

## 2014-12-30 LAB — CULTURE, OB URINE

## 2014-12-31 LAB — PRENATAL PROFILE (SOLSTAS)
ANTIBODY SCREEN: NEGATIVE
Basophils Absolute: 0 10*3/uL (ref 0.0–0.1)
Basophils Relative: 0 % (ref 0–1)
Eosinophils Absolute: 0 10*3/uL (ref 0.0–0.7)
Eosinophils Relative: 0 % (ref 0–5)
HEMATOCRIT: 42.8 % (ref 36.0–46.0)
HEMOGLOBIN: 14.2 g/dL (ref 12.0–15.0)
HIV 1&2 Ab, 4th Generation: NONREACTIVE
Hepatitis B Surface Ag: NEGATIVE
LYMPHS ABS: 1.3 10*3/uL (ref 0.7–4.0)
LYMPHS PCT: 14 % (ref 12–46)
MCH: 30.1 pg (ref 26.0–34.0)
MCHC: 33.2 g/dL (ref 30.0–36.0)
MCV: 90.7 fL (ref 78.0–100.0)
MONOS PCT: 7 % (ref 3–12)
MPV: 10.5 fL (ref 8.6–12.4)
Monocytes Absolute: 0.7 10*3/uL (ref 0.1–1.0)
NEUTROS PCT: 79 % — AB (ref 43–77)
Neutro Abs: 7.5 10*3/uL (ref 1.7–7.7)
Platelets: 250 10*3/uL (ref 150–400)
RBC: 4.72 MIL/uL (ref 3.87–5.11)
RDW: 13.1 % (ref 11.5–15.5)
RH TYPE: POSITIVE
Rubella: 12.3 Index — ABNORMAL HIGH (ref <0.90)
WBC: 9.5 10*3/uL (ref 4.0–10.5)

## 2015-01-01 LAB — CYSTIC FIBROSIS DIAGNOSTIC STUDY: RESULT SUMMARY: DETECTED — AB

## 2015-01-02 ENCOUNTER — Encounter: Payer: Self-pay | Admitting: Advanced Practice Midwife

## 2015-01-02 DIAGNOSIS — O09891 Supervision of other high risk pregnancies, first trimester: Secondary | ICD-10-CM | POA: Insufficient documentation

## 2015-01-02 DIAGNOSIS — Z141 Cystic fibrosis carrier: Secondary | ICD-10-CM

## 2015-01-03 ENCOUNTER — Telehealth: Payer: Self-pay | Admitting: *Deleted

## 2015-01-03 NOTE — Telephone Encounter (Signed)
-----   Message from Aviva Signs, CNM sent at 01/02/2015  9:15 PM EDT ----- Regarding: CF Can you call her and get FOB tested  Meghan Moreno

## 2015-01-03 NOTE — Telephone Encounter (Signed)
Pt notified about positive CF.  I informed her that FOB needs to be tested for CF.  She informed me that she has no contact with him now and that he will not be a part of the baby's life.  I told her that if she does talk with him could she at least ask him to have the test.  She states that she will.  I explained that we would talk more about this at her next visit.

## 2015-01-25 ENCOUNTER — Ambulatory Visit (INDEPENDENT_AMBULATORY_CARE_PROVIDER_SITE_OTHER): Payer: BLUE CROSS/BLUE SHIELD | Admitting: Advanced Practice Midwife

## 2015-01-25 ENCOUNTER — Encounter: Payer: Self-pay | Admitting: Advanced Practice Midwife

## 2015-01-25 VITALS — BP 109/76 | HR 83 | Wt 120.0 lb

## 2015-01-25 DIAGNOSIS — Z141 Cystic fibrosis carrier: Secondary | ICD-10-CM

## 2015-01-25 DIAGNOSIS — O09891 Supervision of other high risk pregnancies, first trimester: Secondary | ICD-10-CM

## 2015-01-25 NOTE — Patient Instructions (Signed)
Second Trimester of Pregnancy The second trimester is from week 13 through week 28, months 4 through 6. The second trimester is often a time when you feel your best. Your body has also adjusted to being pregnant, and you begin to feel better physically. Usually, morning sickness has lessened or quit completely, you may have more energy, and you may have an increase in appetite. The second trimester is also a time when the fetus is growing rapidly. At the end of the sixth month, the fetus is about 9 inches long and weighs about 1 pounds. You will likely begin to feel the baby move (quickening) between 18 and 20 weeks of the pregnancy. BODY CHANGES Your body goes through many changes during pregnancy. The changes vary from woman to woman.   Your weight will continue to increase. You will notice your lower abdomen bulging out.  You may begin to get stretch marks on your hips, abdomen, and breasts.  You may develop headaches that can be relieved by medicines approved by your health care provider.  You may urinate more often because the fetus is pressing on your bladder.  You may develop or continue to have heartburn as a result of your pregnancy.  You may develop constipation because certain hormones are causing the muscles that push waste through your intestines to slow down.  You may develop hemorrhoids or swollen, bulging veins (varicose veins).  You may have back pain because of the weight gain and pregnancy hormones relaxing your joints between the bones in your pelvis and as a result of a shift in weight and the muscles that support your balance.  Your breasts will continue to grow and be tender.  Your gums may bleed and may be sensitive to brushing and flossing.  Dark spots or blotches (chloasma, mask of pregnancy) may develop on your face. This will likely fade after the baby is born.  A dark line from your belly button to the pubic area (linea nigra) may appear. This will likely fade  after the baby is born.  You may have changes in your hair. These can include thickening of your hair, rapid growth, and changes in texture. Some women also have hair loss during or after pregnancy, or hair that feels dry or thin. Your hair will most likely return to normal after your baby is born. WHAT TO EXPECT AT YOUR PRENATAL VISITS During a routine prenatal visit:  You will be weighed to make sure you and the fetus are growing normally.  Your blood pressure will be taken.  Your abdomen will be measured to track your baby's growth.  The fetal heartbeat will be listened to.  Any test results from the previous visit will be discussed. Your health care provider may ask you:  How you are feeling.  If you are feeling the baby move.  If you have had any abnormal symptoms, such as leaking fluid, bleeding, severe headaches, or abdominal cramping.  If you have any questions. Other tests that may be performed during your second trimester include:  Blood tests that check for:  Low iron levels (anemia).  Gestational diabetes (between 24 and 28 weeks).  Rh antibodies.  Urine tests to check for infections, diabetes, or protein in the urine.  An ultrasound to confirm the proper growth and development of the baby.  An amniocentesis to check for possible genetic problems.  Fetal screens for spina bifida and Down syndrome. HOME CARE INSTRUCTIONS   Avoid all smoking, herbs, alcohol, and unprescribed   drugs. These chemicals affect the formation and growth of the baby.  Follow your health care provider's instructions regarding medicine use. There are medicines that are either safe or unsafe to take during pregnancy.  Exercise only as directed by your health care provider. Experiencing uterine cramps is a good sign to stop exercising.  Continue to eat regular, healthy meals.  Wear a good support bra for breast tenderness.  Do not use hot tubs, steam rooms, or saunas.  Wear your  seat belt at all times when driving.  Avoid raw meat, uncooked cheese, cat litter boxes, and soil used by cats. These carry germs that can cause birth defects in the baby.  Take your prenatal vitamins.  Try taking a stool softener (if your health care provider approves) if you develop constipation. Eat more high-fiber foods, such as fresh vegetables or fruit and whole grains. Drink plenty of fluids to keep your urine clear or pale yellow.  Take warm sitz baths to soothe any pain or discomfort caused by hemorrhoids. Use hemorrhoid cream if your health care provider approves.  If you develop varicose veins, wear support hose. Elevate your feet for 15 minutes, 3-4 times a day. Limit salt in your diet.  Avoid heavy lifting, wear low heel shoes, and practice good posture.  Rest with your legs elevated if you have leg cramps or low back pain.  Visit your dentist if you have not gone yet during your pregnancy. Use a soft toothbrush to brush your teeth and be gentle when you floss.  A sexual relationship may be continued unless your health care provider directs you otherwise.  Continue to go to all your prenatal visits as directed by your health care provider. SEEK MEDICAL CARE IF:   You have dizziness.  You have mild pelvic cramps, pelvic pressure, or nagging pain in the abdominal area.  You have persistent nausea, vomiting, or diarrhea.  You have a bad smelling vaginal discharge.  You have pain with urination. SEEK IMMEDIATE MEDICAL CARE IF:   You have a fever.  You are leaking fluid from your vagina.  You have spotting or bleeding from your vagina.  You have severe abdominal cramping or pain.  You have rapid weight gain or loss.  You have shortness of breath with chest pain.  You notice sudden or extreme swelling of your face, hands, ankles, feet, or legs.  You have not felt your baby move in over an hour.  You have severe headaches that do not go away with  medicine.  You have vision changes. Document Released: 05/26/2001 Document Revised: 06/06/2013 Document Reviewed: 08/02/2012 [employer] Patient Information 2015 [employer]. This information is not intended to replace advice given to you by your health care provider. Make sure you discuss any questions you have with your health care provider.  

## 2015-01-25 NOTE — Progress Notes (Signed)
Subjective:  Meghan Moreno is a 19 y.o. G1P0 at [redacted]w[redacted]d being seen today for ongoing prenatal care.  Patient reports no complaints.  Contractions: Not present.  Vag. Bleeding: None. Movement: Absent. Denies leaking of fluid.   The following portions of the patient's history were reviewed and updated as appropriate: allergies, current medications, past family history, past medical history, past social history, past surgical history and problem list.   Objective:   Filed Vitals:   01/25/15 0848  BP: 109/76  Pulse: 83  Weight: 120 lb (54.432 kg)    Fetal Status:     Movement: Absent     General:  Alert, oriented and cooperative. Patient is in no acute distress.  Skin: Skin is warm and dry. No rash noted.   Cardiovascular: Normal heart rate noted  Respiratory: Normal respiratory effort, no problems with respiration noted  Abdomen: Soft, gravid, appropriate for gestational age. Pain/Pressure: Absent     Pelvic: Vag. Bleeding: None Vag D/C Character: Thin   Cervical exam deferred        Extremities: Normal range of motion.  Edema: None  Mental Status: Normal mood and affect. Normal behavior. Normal judgment and thought content.   Urinalysis:      Assessment and Plan:  Pregnancy: G1P0 at [redacted]w[redacted]d  1. 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  Discussed there is a risk if FOB is positive that baby could have CF. Will need to test baby after birth  Preterm labor symptoms and general obstetric precautions including but not limited to vaginal bleeding, contractions, leaking of fluid and fetal movement were reviewed in detail with the patient. Please refer to After Visit Summary for other counseling recommendations.  Return to office in 4 weeks Declines genetic testing  Aviva Signs, CNM

## 2015-02-07 IMAGING — CR DG KNEE COMPLETE 4+V*L*
5 series · 5 of 5 positions shown · non-contrast
Comparison: None.

CLINICAL DATA: Knee pain, no acute trauma

EXAM:
LEFT KNEE - COMPLETE 4+ VIEW

[view not recorded (1 of 5)]
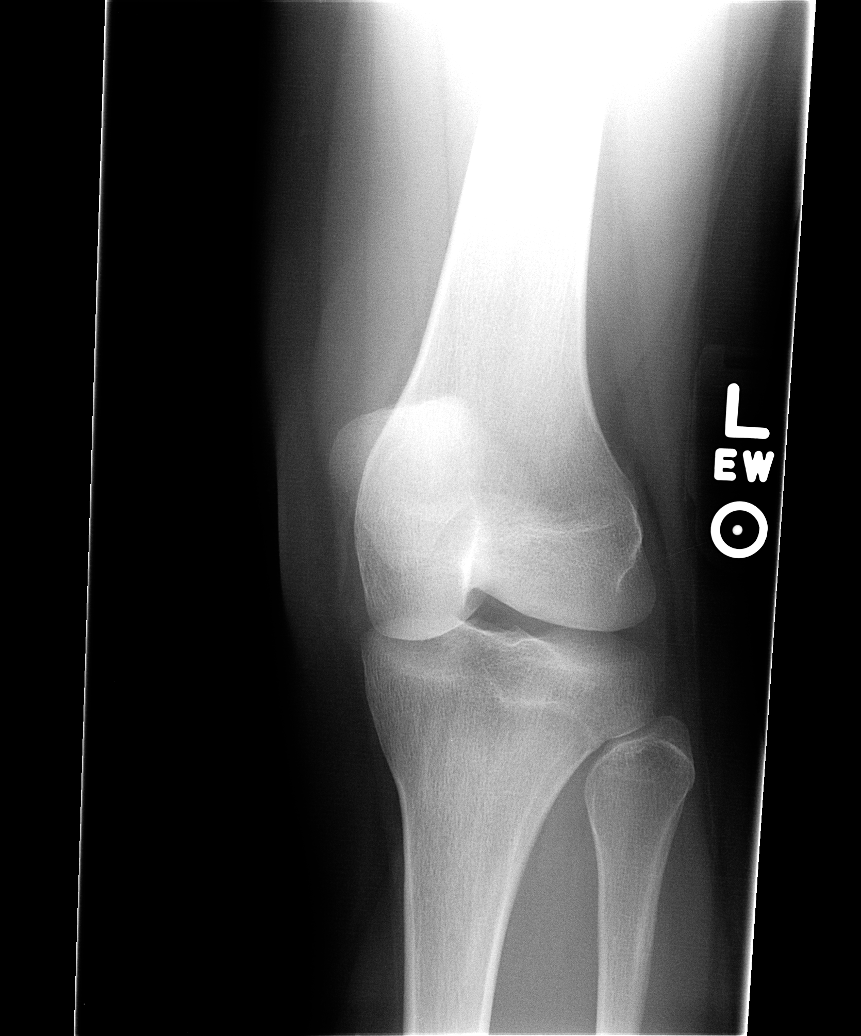

[view not recorded (2 of 5)]
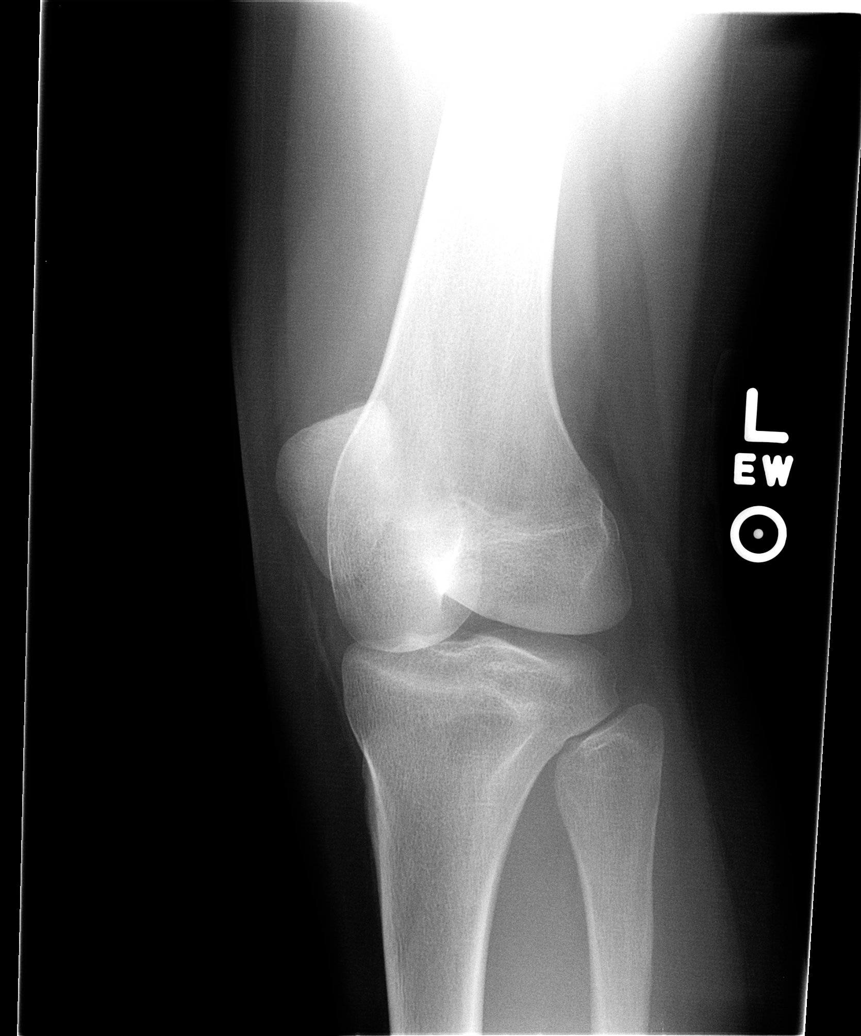

[view not recorded (3 of 5)]
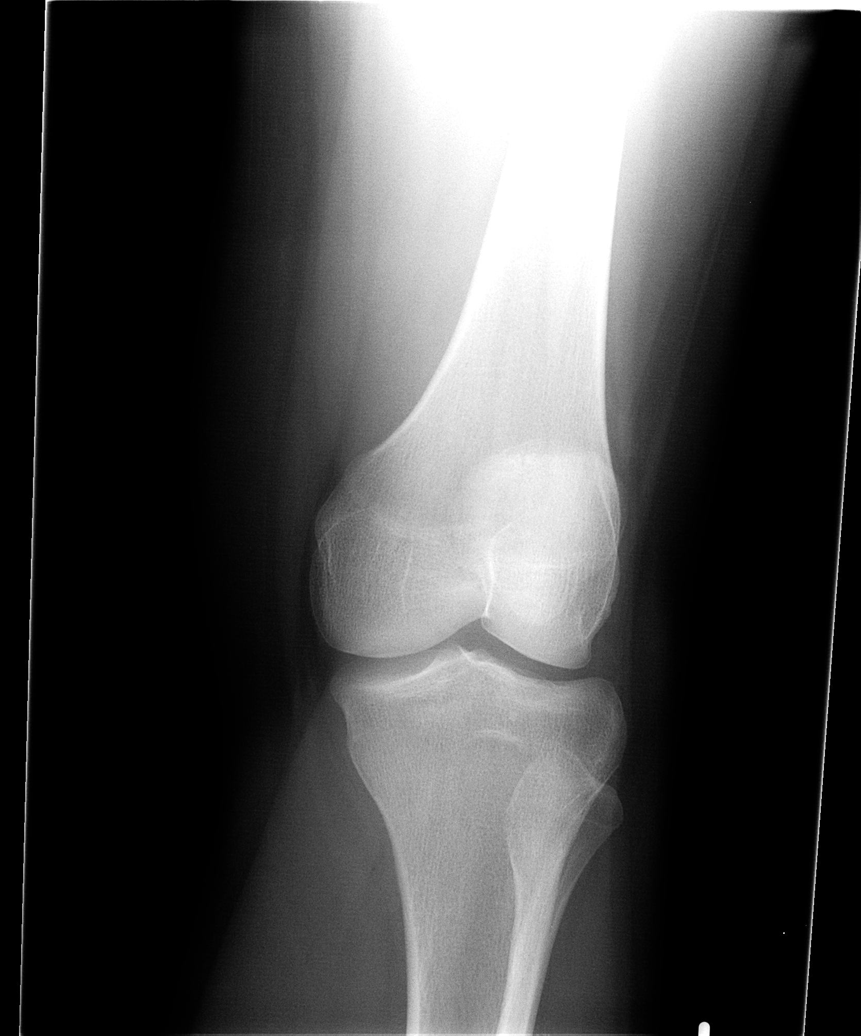

[view not recorded (4 of 5)]
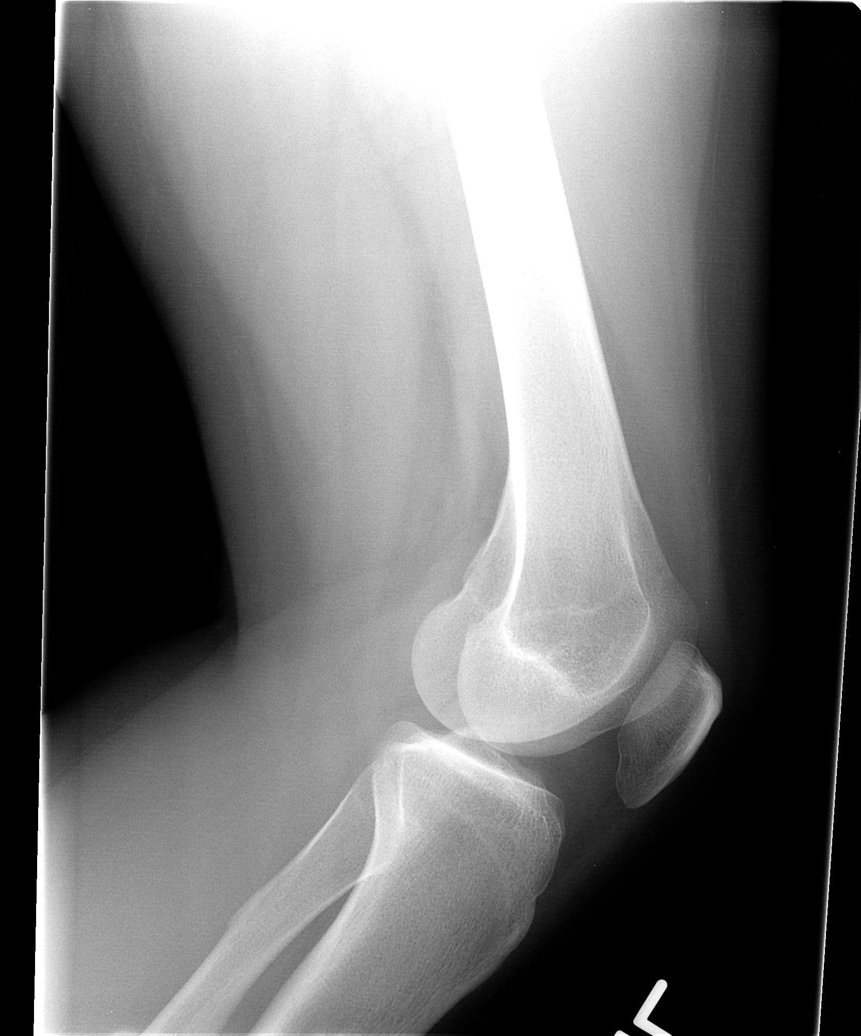

[view not recorded (5 of 5)]
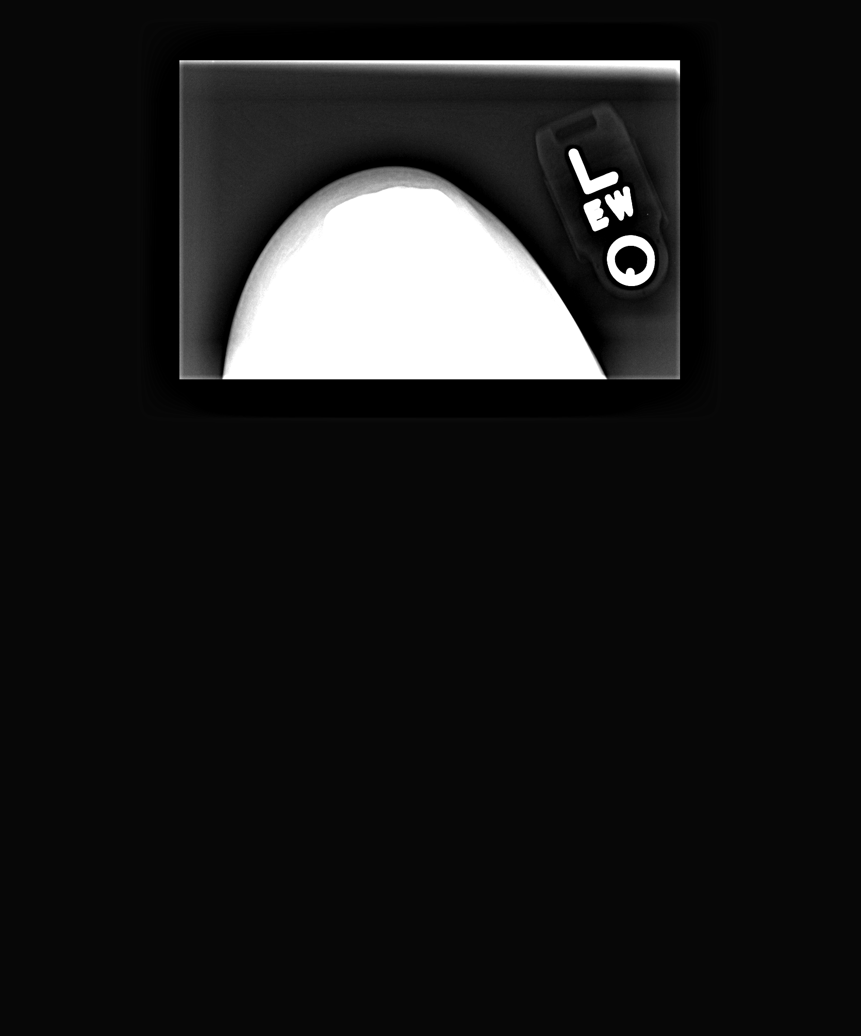

[5 of 5 positions shown; findings below may reference images not displayed]

FINDINGS: The left knee joint spaces appear normal. No fracture or effusion is
seen. The left patella is normally positioned within the
patellofemoral groove.
IMPRESSION: Negative.

## 2015-02-07 IMAGING — CR DG KNEE COMPLETE 4+V*R*
5 series · 5 of 5 positions shown · non-contrast
Comparison: None.

CLINICAL DATA: Knee pain, no acute trauma

EXAM:
RIGHT KNEE - COMPLETE 4+ VIEW

[view not recorded (1 of 5)]
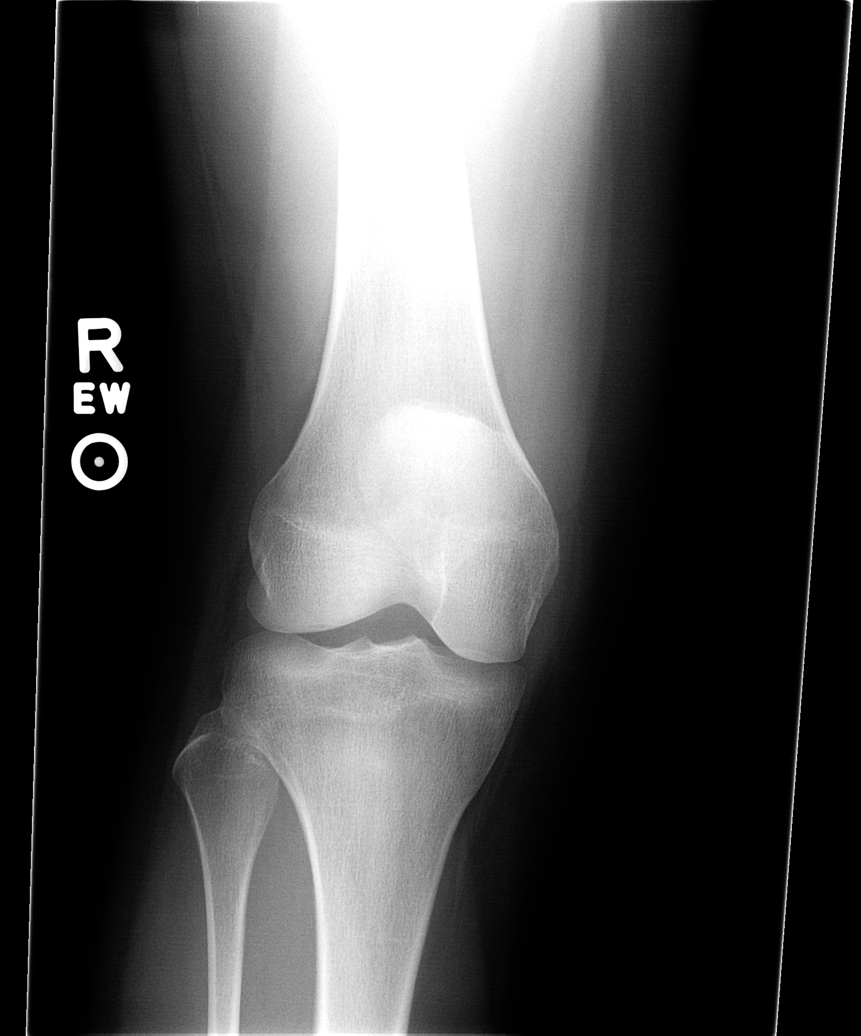

[view not recorded (2 of 5)]
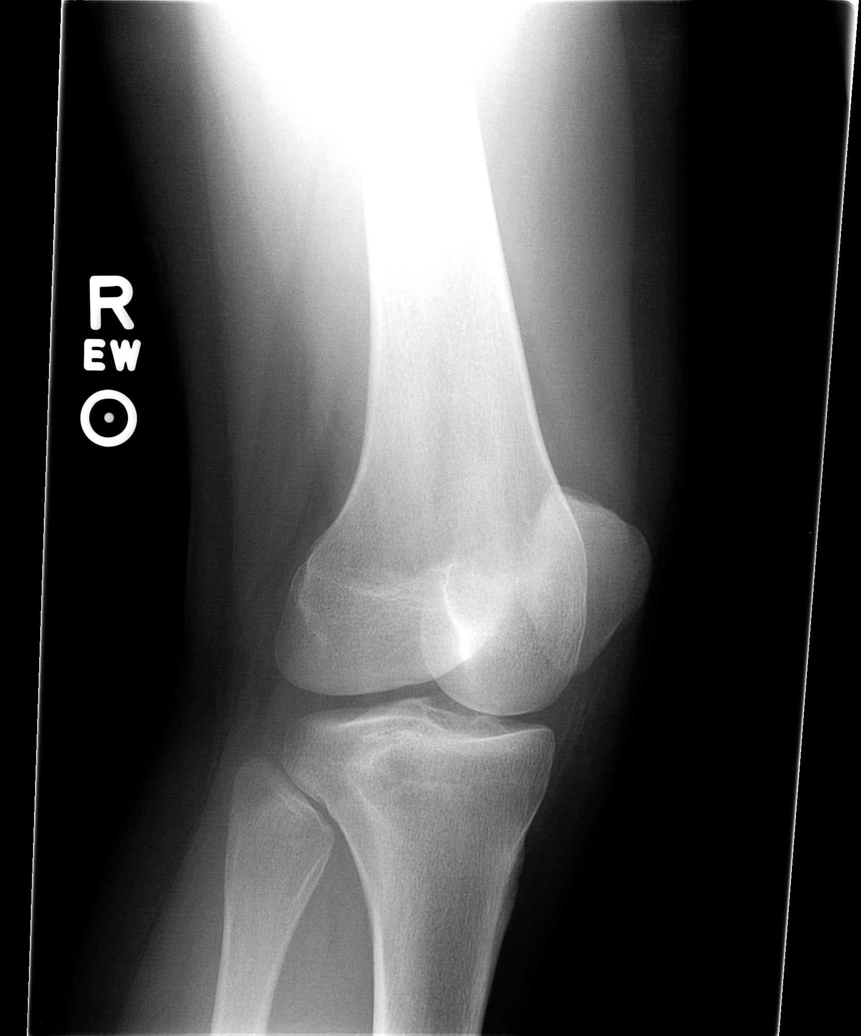

[view not recorded (3 of 5)]
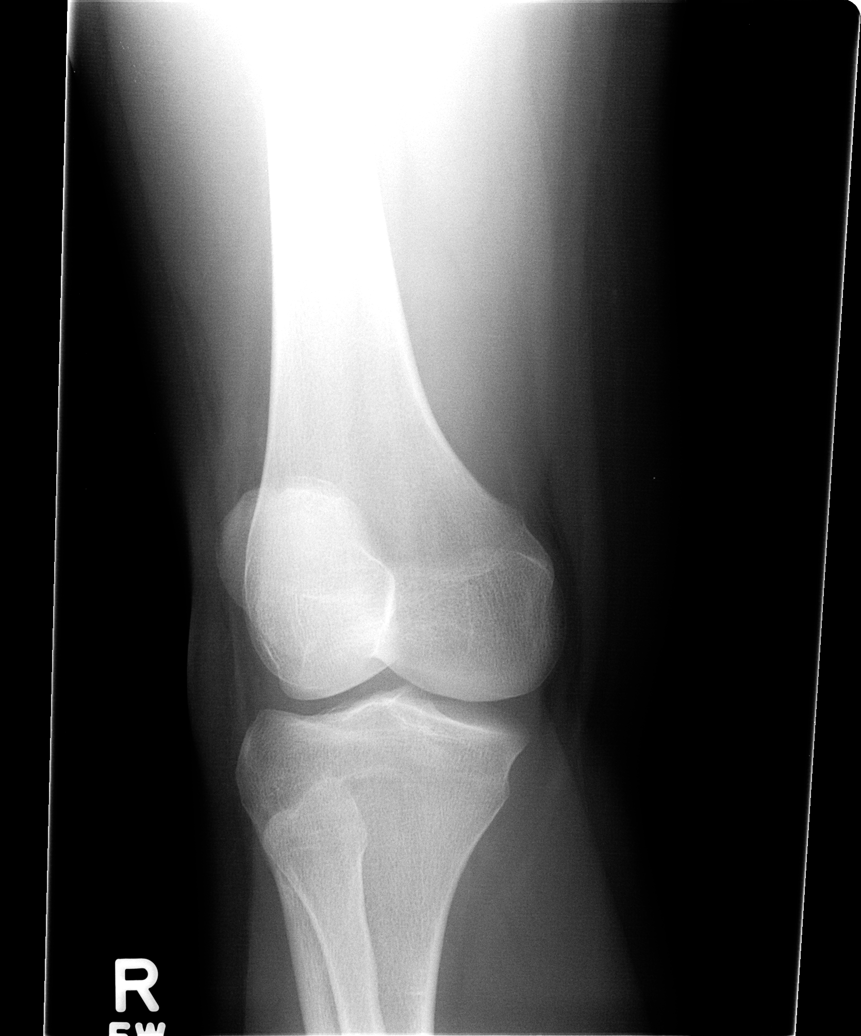

[view not recorded (4 of 5)]
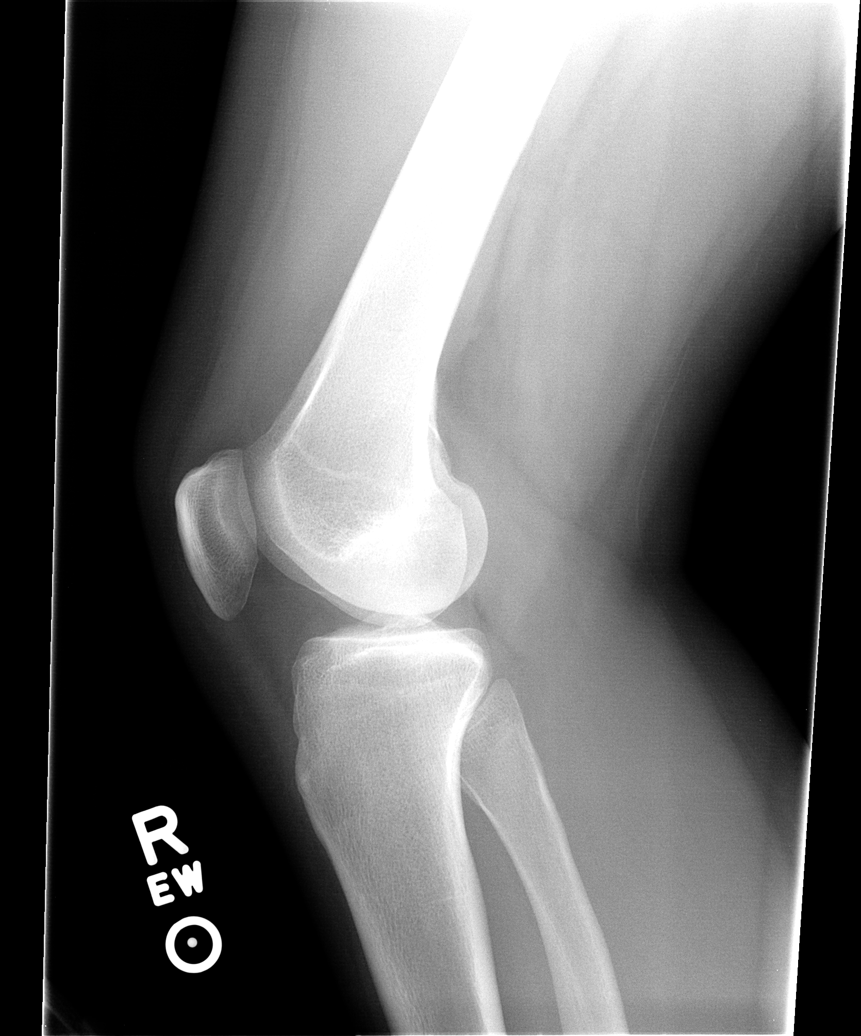

[view not recorded (5 of 5)]
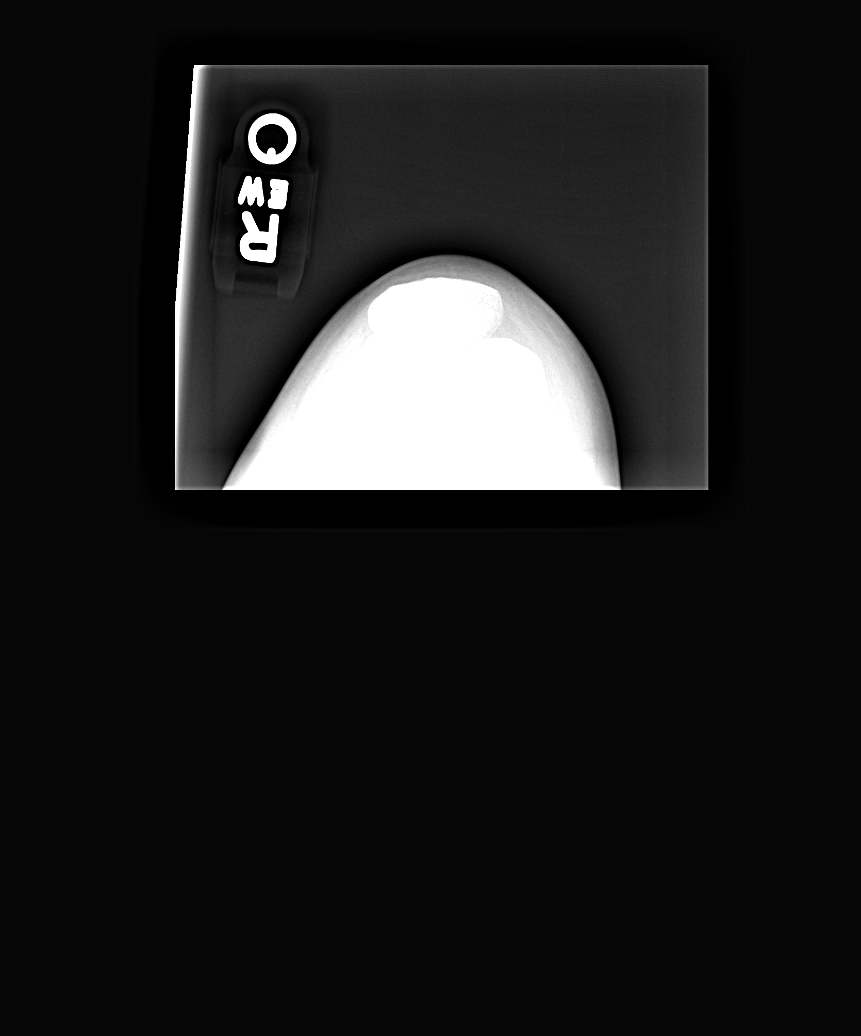

[5 of 5 positions shown; findings below may reference images not displayed]

FINDINGS: The right knee joint spaces appear normal. No joint effusion is
seen. The patella is normally positioned within the patellofemoral
groove.
IMPRESSION: Negative.

## 2015-02-22 ENCOUNTER — Ambulatory Visit (INDEPENDENT_AMBULATORY_CARE_PROVIDER_SITE_OTHER): Payer: BLUE CROSS/BLUE SHIELD | Admitting: Family

## 2015-02-22 VITALS — BP 110/69 | HR 85 | Wt 123.0 lb

## 2015-02-22 DIAGNOSIS — Z3402 Encounter for supervision of normal first pregnancy, second trimester: Secondary | ICD-10-CM

## 2015-02-22 NOTE — Progress Notes (Signed)
Subjective:  Meghan Moreno is a 19 y.o. G1P0 at [redacted]w[redacted]d being seen today for ongoing prenatal care.  Patient reports no complaints.  Contractions: Not present.  Vag. Bleeding: None. Movement: Present. Denies leaking of fluid. Here with parents.    The following portions of the patient's history were reviewed and updated as appropriate: allergies, current medications, past family history, past medical history, past social history, past surgical history and problem list.   Objective:   Filed Vitals:   02/22/15 1034  BP: 110/69  Pulse: 85  Weight: 123 lb (55.792 kg)    Fetal Status: Fetal Heart Rate (bpm): 138 Fundal Height: 16 cm Movement: Present     General:  Alert, oriented and cooperative. Patient is in no acute distress.  Skin: Skin is warm and dry. No rash noted.   Cardiovascular: Normal heart rate noted  Respiratory: Normal respiratory effort, no problems with respiration noted  Abdomen: Soft, gravid, appropriate for gestational age. Pain/Pressure: Absent     Pelvic: Vag. Bleeding: None Vag D/C Character: Thin   Cervical exam deferred        Extremities: Normal range of motion.  Edema: None  Mental Status: Normal mood and affect. Normal behavior. Normal judgment and thought content.   Urinalysis: Urine Protein: Negative Urine Glucose: Negative  Assessment and Plan:  Pregnancy: G1P0 at [redacted]w[redacted]d  1. 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 Please refer to After Visit Summary for other counseling recommendations.  Return in about 4 weeks (around  03/22/2015).   Eino Farber Kennith Gain, CNM

## 2015-02-22 NOTE — Progress Notes (Signed)
[facility] pt - needs Anatomy scan scheduled before next appointment

## 2015-03-08 ENCOUNTER — Ambulatory Visit (HOSPITAL_COMMUNITY)
Admission: RE | Admit: 2015-03-08 | Discharge: 2015-03-08 | Disposition: A | Discharge status: Home / Self Care | Payer: Medicaid Other | Source: Ambulatory Visit | Attending: Family | Admitting: Family

## 2015-03-08 ENCOUNTER — Other Ambulatory Visit: Payer: Self-pay | Admitting: Family

## 2015-03-08 DIAGNOSIS — Z3402 Encounter for supervision of normal first pregnancy, second trimester: Secondary | ICD-10-CM

## 2015-03-08 DIAGNOSIS — O09892 Supervision of other high risk pregnancies, second trimester: Secondary | ICD-10-CM | POA: Diagnosis not present

## 2015-03-08 DIAGNOSIS — Z141 Cystic fibrosis carrier: Secondary | ICD-10-CM | POA: Insufficient documentation

## 2015-03-08 DIAGNOSIS — Z3A18 18 weeks gestation of pregnancy: Secondary | ICD-10-CM

## 2015-03-11 NOTE — Addendum Note (Signed)
Encounter addended by: Marlis Edelson, CNM on: 03/11/2015  1:20 PM<BR>     Documentation filed: Problem List

## 2015-03-21 ENCOUNTER — Ambulatory Visit (HOSPITAL_COMMUNITY): Payer: BLUE CROSS/BLUE SHIELD | Attending: Family

## 2015-03-22 ENCOUNTER — Ambulatory Visit (INDEPENDENT_AMBULATORY_CARE_PROVIDER_SITE_OTHER): Payer: Self-pay | Admitting: Advanced Practice Midwife

## 2015-03-22 VITALS — BP 106/65 | HR 86 | Wt 129.0 lb

## 2015-03-22 DIAGNOSIS — Z3402 Encounter for supervision of normal first pregnancy, second trimester: Secondary | ICD-10-CM

## 2015-03-22 NOTE — Patient Instructions (Signed)

## 2015-03-22 NOTE — Progress Notes (Signed)
Subjective:  Meghan Moreno is a 19 y.o. G1P0 at [redacted]w[redacted]d being seen today for ongoing prenatal care.  Patient reports no bleeding, no contractions and some round ligament pain.  Contractions: Not present.  Vag. Bleeding: None. Movement: Present. Denies leaking of fluid.   The following portions of the patient's history were reviewed and updated as appropriate: allergies, current medications, past family history, past medical history, past social history, past surgical history and problem list.   Objective:   Filed Vitals:   03/22/15 1040  BP: 106/65  Pulse: 86  Weight: 58.514 kg (129 lb)    Fetal Status: Fetal Heart Rate (bpm): 141   Movement: Present     General:  Alert, oriented and cooperative. Patient is in no acute distress.  Skin: Skin is warm and dry. No rash noted.   Cardiovascular: Normal heart rate noted  Respiratory: Normal respiratory effort, no problems with respiration noted  Abdomen: Soft, gravid, appropriate for gestational age. Pain/Pressure: Absent     Pelvic: Vag. Bleeding: None Vag D/C Character: Thin   Cervical exam deferred        Extremities: Normal range of motion.  Edema: None  Mental Status: Normal mood and affect. Normal behavior. Normal judgment and thought content.   Urinalysis: Urine Protein: Negative Urine Glucose: Negative  Assessment and Plan:  Pregnancy: G1P0 at [redacted]w[redacted]d  SIUP at [redacted]w[redacted]d Round ligament pain, discussed  Preterm labor symptoms and general obstetric precautions including but not limited to vaginal bleeding, contractions, leaking of fluid and fetal movement were reviewed in detail with the patient. Please refer to After Visit Summary for other counseling recommendations.   Return to office at 28 wks or sooner if needed  Aviva Signs, CNM

## 2015-03-29 ENCOUNTER — Ambulatory Visit (INDEPENDENT_AMBULATORY_CARE_PROVIDER_SITE_OTHER): Payer: Self-pay | Admitting: Osteopathic Medicine

## 2015-03-29 ENCOUNTER — Encounter: Payer: Self-pay | Admitting: Osteopathic Medicine

## 2015-03-29 VITALS — BP 110/71 | HR 88 | Wt 129.0 lb

## 2015-03-29 DIAGNOSIS — R112 Nausea with vomiting, unspecified: Secondary | ICD-10-CM

## 2015-03-29 DIAGNOSIS — J019 Acute sinusitis, unspecified: Secondary | ICD-10-CM

## 2015-03-29 DIAGNOSIS — B9789 Other viral agents as the cause of diseases classified elsewhere: Principal | ICD-10-CM

## 2015-03-29 DIAGNOSIS — J069 Acute upper respiratory infection, unspecified: Secondary | ICD-10-CM

## 2015-03-29 MED ORDER — ONDANSETRON 4 MG PO TBDP: 4.0000 mg | ORAL_TABLET | Freq: Three times a day (TID) | ORAL | Status: DC | PRN

## 2015-03-29 MED ORDER — FLUTICASONE PROPIONATE 50 MCG/ACT NA SUSP: 2.0000 | Freq: Every day | NASAL | Status: DC

## 2015-03-29 MED ORDER — GUAIFENESIN 200 MG PO TABS: 200.0000 mg | ORAL_TABLET | ORAL | Status: DC | PRN

## 2015-03-29 NOTE — Progress Notes (Signed)
HPI: Meghan Moreno is a 19 y.o. female [facility] [place] for chief complaint of:  Chief Complaint  Patient presents with  . Acute Visit    [redacted] weeks pregnant w/ cold & congestion   Cold  . Location:  Chest/sinuses . Severity: moderate . Duration: 4 days . Context: feeling better yesterday but this morning was coughing worse, coughed so hard threw up  . Modifying factors: pregnant, hasn't taken anything . Assoc signs/symptoms: nausea   Past medical, social and family history reviewed: Past Medical History  Diagnosis Date  . Medical history non-contributory    No past surgical history on file. Social History  Substance Use Topics  . Smoking status: Former Smoker -- 0.30 packs/day for .5 years    Types: Cigarettes  . Smokeless tobacco: Not on file  . Alcohol Use: No   Family History  Problem Relation Age of Onset  . Leukemia Mother   . Stroke Maternal Grandmother     grandparent  . Diabetes Father     Current Outpatient Prescriptions  Medication Sig Dispense Refill  . ondansetron (ZOFRAN-ODT) 4 MG disintegrating tablet Take 4 mg by mouth every 6 (six) hours.  0  . Prenatal Vit-Fe Fumarate-FA (MULTIVITAMIN-PRENATAL) 27-0.8 MG TABS tablet Take 1 tablet by mouth daily at 12 noon.     No current facility-administered medications for this visit.   No Known Allergies    Review of Systems: CONSTITUTIONAL: Neg fever/chills, no unintentional weight changes HEAD/EYES/EARS/NOSE/THROAT: No headache/vision change or hearing change, no sore throat, (+) congestion as per HPI CARDIAC: No chest pain/pressure/palpitations, no orthopnea RESPIRATORY: (+) cough, no shortness of breath/wheeze GASTROINTESTINAL: (+) nausea/vomiting, no abdominal pain/blood in stool/diarrhea/constipation   Exam:  BP 110/71 mmHg  Pulse 88  Wt 129 lb (58.514 kg)  SpO2 98%  LMP 10/29/2014 Constitutional: VSS, see above. General Appearance: alert,  well-developed, well-nourished, NAD Eyes: Normal lids and conjunctive, non-icteric sclera, PERRLA Ears, Nose, Mouth, Throat: Normal external inspection ears/nares/mouth/lips/gums, Normal TM bilaterally, MMM, posterior pharynx without erythema/exudate Neck: No masses, trachea midline. No thyroid enlargement/tenderness/mass appreciated Respiratory: Normal respiratory effort. no wheeze/rhonchi/rales Cardiovascular: S1/S2 normal, no murmur/rub/gallop auscultated. RRR.   No results found for this or any previous visit (from the past 72 hour(s)).    ASSESSMENT/PLAN:  Viral URI with cough - Plan: guaiFENesin 200 MG tablet  Acute rhinosinusitis - Plan: fluticasone (FLONASE) 50 MCG/ACT nasal spray  Non-intractable vomiting with nausea, unspecified vomiting type - Plan: ondansetron (ZOFRAN-ODT) 4 MG disintegrating tablet   Advised the patient can speak to pharmacist regarding any medication effects pregnancy/.

## 2015-05-17 ENCOUNTER — Ambulatory Visit (INDEPENDENT_AMBULATORY_CARE_PROVIDER_SITE_OTHER): Payer: Medicaid Other | Admitting: Family

## 2015-05-17 VITALS — BP 119/79 | HR 105 | Wt 140.0 lb

## 2015-05-17 DIAGNOSIS — Z23 Encounter for immunization: Secondary | ICD-10-CM

## 2015-05-17 DIAGNOSIS — Z36 Encounter for antenatal screening of mother: Secondary | ICD-10-CM | POA: Diagnosis not present

## 2015-05-17 DIAGNOSIS — Z3403 Encounter for supervision of normal first pregnancy, third trimester: Secondary | ICD-10-CM | POA: Diagnosis not present

## 2015-05-17 DIAGNOSIS — Z3402 Encounter for supervision of normal first pregnancy, second trimester: Secondary | ICD-10-CM

## 2015-05-17 DIAGNOSIS — Z3493 Encounter for supervision of normal pregnancy, unspecified, third trimester: Secondary | ICD-10-CM

## 2015-05-17 LAB — CBC
HEMATOCRIT: 35.4 % — AB (ref 36.0–46.0)
HEMOGLOBIN: 11.6 g/dL — AB (ref 12.0–15.0)
MCH: 29.7 pg (ref 26.0–34.0)
MCHC: 32.8 g/dL (ref 30.0–36.0)
MCV: 90.5 fL (ref 78.0–100.0)
MPV: 11 fL (ref 8.6–12.4)
Platelets: 228 10*3/uL (ref 150–400)
RBC: 3.91 MIL/uL (ref 3.87–5.11)
RDW: 12.9 % (ref 11.5–15.5)
WBC: 9.8 10*3/uL (ref 4.0–10.5)

## 2015-05-17 NOTE — Progress Notes (Signed)
Subjective:  Meghan Moreno is a 19 y.o. G1P0 at 3279w4d being seen today for ongoing prenatal care.  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  Patient reports no complaints.  Contractions: Not present. Vag. Bleeding: None.  Movement: Present. Denies leaking of fluid.   The following portions of the patient's history were reviewed and updated as appropriate: allergies, current medications, past family history, past medical history, past social history, past surgical history and problem list. Problem list updated.  Objective:   Filed Vitals:   05/17/15 1046  BP: 119/79  Pulse: 105  Weight: 140 lb (63.504 kg)    Fetal Status: Fetal Heart Rate (bpm): 144 Fundal Height: 28 cm Movement: Present     General:  Alert, oriented and cooperative. Patient is in no acute distress.  Skin: Skin is warm and dry. No rash noted.   Cardiovascular: Normal heart rate noted  Respiratory: Normal respiratory effort, no problems with respiration noted  Abdomen: Soft, gravid, appropriate for gestational age. Pain/Pressure: Absent     Pelvic: Vag. Bleeding: None Vag D/C Character: Thin   Cervical exam deferred        Extremities: Normal range of motion.  Edema: None  Mental Status: Normal mood and affect. Normal behavior. Normal judgment and thought content.   Urinalysis:    Protein negative Glucose negative  Assessment and Plan:  Pregnancy: G1P0 at 979w4d  1. Supervision of normal pregnancy, third trimester - Glucose Tolerance, 1 HR (50g) - CBC - RPR - HIV antibody (with reflex) - Tdap vaccine greater than or equal to 7yo IM  Preterm labor symptoms and general obstetric precautions including but not limited to vaginal bleeding, contractions, leaking of fluid and fetal movement were reviewed in detail with the patient. Please refer to After Visit Summary for other counseling  recommendations.   [facility] schedule  Marlis EdelsonWalidah N Karim, CNM

## 2015-05-18 LAB — GLUCOSE TOLERANCE, 1 HOUR (50G) W/O FASTING: GLUCOSE 1 HOUR GTT: 132 mg/dL (ref 70–140)

## 2015-05-18 LAB — RPR

## 2015-05-20 LAB — HIV ANTIBODY (ROUTINE TESTING W REFLEX): HIV 1&2 Ab, 4th Generation: NONREACTIVE

## 2015-05-21 ENCOUNTER — Telehealth: Payer: Self-pay | Admitting: *Deleted

## 2015-05-21 NOTE — Telephone Encounter (Signed)
Pt notified of normal 1 hr GTT. 

## 2015-05-22 ENCOUNTER — Encounter: Payer: Self-pay | Admitting: *Deleted

## 2015-06-13 ENCOUNTER — Ambulatory Visit (INDEPENDENT_AMBULATORY_CARE_PROVIDER_SITE_OTHER): Payer: Medicaid Other | Admitting: Family Medicine

## 2015-06-13 VITALS — BP 120/79 | HR 105 | Wt 141.0 lb

## 2015-06-13 DIAGNOSIS — Z3402 Encounter for supervision of normal first pregnancy, second trimester: Secondary | ICD-10-CM

## 2015-06-13 NOTE — Patient Instructions (Signed)
Third Trimester of Pregnancy The third trimester is from week 29 through week 42, months 7 through 9. The third trimester is a time when the fetus is growing rapidly. At the end of the ninth month, the fetus is about 20 inches in length and weighs 6-10 pounds.  BODY CHANGES Your body goes through many changes during pregnancy. The changes vary from woman to woman.   Your weight will continue to increase. You can expect to gain 25-35 pounds (11-16 kg) by the end of the pregnancy.  You may begin to get stretch marks on your hips, abdomen, and breasts.  You may urinate more often because the fetus is moving lower into your pelvis and pressing on your bladder.  You may develop or continue to have heartburn as a result of your pregnancy.  You may develop constipation because certain hormones are causing the muscles that push waste through your intestines to slow down.  You may develop hemorrhoids or swollen, bulging veins (varicose veins).  You may have pelvic pain because of the weight gain and pregnancy hormones relaxing your joints between the bones in your pelvis. Backaches may result from overexertion of the muscles supporting your posture.  You may have changes in your hair. These can include thickening of your hair, rapid growth, and changes in texture. Some women also have hair loss during or after pregnancy, or hair that feels dry or thin. Your hair will most likely return to normal after your baby is born.  Your breasts will continue to grow and be tender. A yellow discharge may leak from your breasts called colostrum.  Your belly button may stick out.  You may feel short of breath because of your expanding uterus.  You may notice the fetus "dropping," or moving lower in your abdomen.  You may have a bloody mucus discharge. This usually occurs a few days to a week before labor begins.  Your cervix becomes thin and soft (effaced) near your due date. WHAT TO EXPECT AT YOUR  PRENATAL EXAMS  You will have prenatal exams every 2 weeks until week 36. Then, you will have weekly prenatal exams. During a routine prenatal visit:  You will be weighed to make sure you and the fetus are growing normally.  Your blood pressure is taken.  Your abdomen will be measured to track your baby's growth.  The fetal heartbeat will be listened to.  Any test results from the previous visit will be discussed.  You may have a cervical check near your due date to see if you have effaced. At around 36 weeks, your caregiver will check your cervix. At the same time, your caregiver will also perform a test on the secretions of the vaginal tissue. This test is to determine if a type of bacteria, Group B streptococcus, is present. Your caregiver will explain this further. Your caregiver may ask you:  What your birth plan is.  How you are feeling.  If you are feeling the baby move.  If you have had any abnormal symptoms, such as leaking fluid, bleeding, severe headaches, or abdominal cramping.  If you are using any tobacco products, including cigarettes, chewing tobacco, and electronic cigarettes.  If you have any questions. Other tests or screenings that may be performed during your third trimester include:  Blood tests that check for low iron levels (anemia).  Fetal testing to check the health, activity level, and growth of the fetus. Testing is done if you have certain medical conditions or if   there are problems during the pregnancy.  HIV (human immunodeficiency virus) testing. If you are at high risk, you may be screened for HIV during your third trimester of pregnancy. FALSE LABOR You may feel small, irregular contractions that eventually go away. These are called Braxton Hicks contractions, or false labor. Contractions may last for hours, days, or even weeks before true labor sets in. If contractions come at regular intervals, intensify, or become painful, it is best to be seen  by your caregiver.  SIGNS OF LABOR   Menstrual-like cramps.  Contractions that are 5 minutes apart or less.  Contractions that start on the top of the uterus and spread down to the lower abdomen and back.  A sense of increased pelvic pressure or back pain.  A watery or bloody mucus discharge that comes from the vagina. If you have any of these signs before the 37th week of pregnancy, call your caregiver right away. You need to go to the hospital to get checked immediately. HOME CARE INSTRUCTIONS   Avoid all smoking, herbs, alcohol, and unprescribed drugs. These chemicals affect the formation and growth of the baby.  Do not use any tobacco products, including cigarettes, chewing tobacco, and electronic cigarettes. If you need help quitting, ask your health care provider. You may receive counseling support and other resources to help you quit.  Follow your caregiver's instructions regarding medicine use. There are medicines that are either safe or unsafe to take during pregnancy.  Exercise only as directed by your caregiver. Experiencing uterine cramps is a good sign to stop exercising.  Continue to eat regular, healthy meals.  Wear a good support bra for breast tenderness.  Do not use hot tubs, steam rooms, or saunas.  Wear your seat belt at all times when driving.  Avoid raw meat, uncooked cheese, cat litter boxes, and soil used by cats. These carry germs that can cause birth defects in the baby.  Take your prenatal vitamins.  Take 1500-2000 mg of calcium daily starting at the 20th week of pregnancy until you deliver your baby.  Try taking a stool softener (if your caregiver approves) if you develop constipation. Eat more high-fiber foods, such as fresh vegetables or fruit and whole grains. Drink plenty of fluids to keep your urine clear or pale yellow.  Take warm sitz baths to soothe any pain or discomfort caused by hemorrhoids. Use hemorrhoid cream if your caregiver  approves.  If you develop varicose veins, wear support hose. Elevate your feet for 15 minutes, 3-4 times a day. Limit salt in your diet.  Avoid heavy lifting, wear low heal shoes, and practice good posture.  Rest a lot with your legs elevated if you have leg cramps or low back pain.  Visit your dentist if you have not gone during your pregnancy. Use a soft toothbrush to brush your teeth and be gentle when you floss.  A sexual relationship may be continued unless your caregiver directs you otherwise.  Do not travel far distances unless it is absolutely necessary and only with the approval of your caregiver.  Take prenatal classes to understand, practice, and ask questions about the labor and delivery.  Make a trial run to the hospital.  Pack your hospital bag.  Prepare the baby's nursery.  Continue to go to all your prenatal visits as directed by your caregiver. SEEK MEDICAL CARE IF:  You are unsure if you are in labor or if your water has broken.  You have dizziness.  You have   mild pelvic cramps, pelvic pressure, or nagging pain in your abdominal area.  You have persistent nausea, vomiting, or diarrhea.  You have a bad smelling vaginal discharge.  You have pain with urination. SEEK IMMEDIATE MEDICAL CARE IF:   You have a fever.  You are leaking fluid from your vagina.  You have spotting or bleeding from your vagina.  You have severe abdominal cramping or pain.  You have rapid weight loss or gain.  You have shortness of breath with chest pain.  You notice sudden or extreme swelling of your face, hands, ankles, feet, or legs.  You have not felt your baby move in over an hour.  You have severe headaches that do not go away with medicine.  You have vision changes.   This information is not intended to replace advice given to you by your health care provider. Make sure you discuss any questions you have with your health care provider.   Document Released:  05/26/2001 Document Revised: 06/22/2014 Document Reviewed: 08/02/2012 Elsevier Interactive Patient Education 2016 [employer]  Breastfeeding Deciding to breastfeed is one of the best choices you can make for you and your baby. A change in hormones during pregnancy causes your breast tissue to grow and increases the number and size of your milk ducts. These hormones also allow proteins, sugars, and fats from your blood supply to make breast milk in your milk-producing glands. Hormones prevent breast milk from being released before your baby is born as well as prompt milk flow after birth. Once breastfeeding has begun, thoughts of your baby, as well as his or her sucking or crying, can stimulate the release of milk from your milk-producing glands.  BENEFITS OF BREASTFEEDING For Your Baby  Your first milk (colostrum) helps your baby's digestive system function better.  There are antibodies in your milk that help your baby fight off infections.  Your baby has a lower incidence of asthma, allergies, and sudden infant death syndrome.  The nutrients in breast milk are better for your baby than infant formulas and are designed uniquely for your baby's needs.  Breast milk improves your baby's brain development.  Your baby is less likely to develop other conditions, such as childhood obesity, asthma, or type 2 diabetes mellitus. For You  Breastfeeding helps to create a very special bond between you and your baby.  Breastfeeding is convenient. Breast milk is always available at the correct temperature and costs nothing.  Breastfeeding helps to burn calories and helps you lose the weight gained during pregnancy.  Breastfeeding makes your uterus contract to its prepregnancy size faster and slows bleeding (lochia) after you give birth.   Breastfeeding helps to lower your risk of developing type 2 diabetes mellitus, osteoporosis, and breast or ovarian cancer later in life. SIGNS THAT YOUR BABY IS  HUNGRY Early Signs of Hunger  Increased alertness or activity.  Stretching.  Movement of the head from side to side.  Movement of the head and opening of the mouth when the corner of the mouth or cheek is stroked (rooting).  Increased sucking sounds, smacking lips, cooing, sighing, or squeaking.  Hand-to-mouth movements.  Increased sucking of fingers or hands. Late Signs of Hunger  Fussing.  Intermittent crying. Extreme Signs of Hunger Signs of extreme hunger will require calming and consoling before your baby will be able to breastfeed successfully. Do not wait for the following signs of extreme hunger to occur before you initiate breastfeeding:  Restlessness.  A loud, strong cry.  Screaming.   BREASTFEEDING BASICS Breastfeeding Initiation  Find a comfortable place to sit or lie down, with your neck and back well supported.  Place a pillow or rolled up blanket under your baby to bring him or her to the level of your breast (if you are seated). Nursing pillows are specially designed to help support your arms and your baby while you breastfeed.  Make sure that your baby's abdomen is facing your abdomen.  Gently massage your breast. With your fingertips, massage from your chest wall toward your nipple in a circular motion. This encourages milk flow. You may need to continue this action during the feeding if your milk flows slowly.  Support your breast with 4 fingers underneath and your thumb above your nipple. Make sure your fingers are well away from your nipple and your baby's mouth.  Stroke your baby's lips gently with your finger or nipple.  When your baby's mouth is open wide enough, quickly bring your baby to your breast, placing your entire nipple and as much of the colored area around your nipple (areola) as possible into your baby's mouth.  More areola should be visible above your baby's upper lip than below the lower lip.  Your baby's tongue should be between his  or her lower gum and your breast.  Ensure that your baby's mouth is correctly positioned around your nipple (latched). Your baby's lips should create a seal on your breast and be turned out (everted).  It is common for your baby to suck about 2-3 minutes in order to start the flow of breast milk. Latching Teaching your baby how to latch on to your breast properly is very important. An improper latch can cause nipple pain and decreased milk supply for you and poor weight gain in your baby. Also, if your baby is not latched onto your nipple properly, he or she may swallow some air during feeding. This can make your baby fussy. Burping your baby when you switch breasts during the feeding can help to get rid of the air. However, teaching your baby to latch on properly is still the best way to prevent fussiness from swallowing air while breastfeeding. Signs that your baby has successfully latched on to your nipple:  Silent tugging or silent sucking, without causing you pain.  Swallowing heard between every 3-4 sucks.  Muscle movement above and in front of his or her ears while sucking. Signs that your baby has not successfully latched on to nipple:  Sucking sounds or smacking sounds from your baby while breastfeeding.  Nipple pain. If you think your baby has not latched on correctly, slip your finger into the corner of your baby's mouth to break the suction and place it between your baby's gums. Attempt breastfeeding initiation again. Signs of Successful Breastfeeding Signs from your baby:  A gradual decrease in the number of sucks or complete cessation of sucking.  Falling asleep.  Relaxation of his or her body.  Retention of a small amount of milk in his or her mouth.  Letting go of your breast by himself or herself. Signs from you:  Breasts that have increased in firmness, weight, and size 1-3 hours after feeding.  Breasts that are softer immediately after  breastfeeding.  Increased milk volume, as well as a change in milk consistency and color by the fifth day of breastfeeding.  Nipples that are not sore, cracked, or bleeding. Signs That Your Baby is Getting Enough Milk  Wetting at least 3 diapers in a 24-hour period.   The urine should be clear and pale yellow by age 5 days.  At least 3 stools in a 24-hour period by age 5 days. The stool should be soft and yellow.  At least 3 stools in a 24-hour period by age 7 days. The stool should be seedy and yellow.  No loss of weight greater than 10% of birth weight during the first 3 days of age.  Average weight gain of 4-7 ounces (113-198 g) per week after age 4 days.  Consistent daily weight gain by age 5 days, without weight loss after the age of 2 weeks. After a feeding, your baby may spit up a small amount. This is common. BREASTFEEDING FREQUENCY AND DURATION Frequent feeding will help you make more milk and can prevent sore nipples and breast engorgement. Breastfeed when you feel the need to reduce the fullness of your breasts or when your baby shows signs of hunger. This is called "breastfeeding on demand." Avoid introducing a pacifier to your baby while you are working to establish breastfeeding (the first 4-6 weeks after your baby is born). After this time you may choose to use a pacifier. Research has shown that pacifier use during the first year of a baby's life decreases the risk of sudden infant death syndrome (SIDS). Allow your baby to feed on each breast as long as he or she wants. Breastfeed until your baby is finished feeding. When your baby unlatches or falls asleep while feeding from the first breast, offer the second breast. Because newborns are often sleepy in the first few weeks of life, you may need to awaken your baby to get him or her to feed. Breastfeeding times will vary from baby to baby. However, the following rules can serve as a guide to help you ensure that your baby is  properly fed:  Newborns (babies 4 weeks of age or younger) may breastfeed every 1-3 hours.  Newborns should not go longer than 3 hours during the day or 5 hours during the night without breastfeeding.  You should breastfeed your baby a minimum of 8 times in a 24-hour period until you begin to introduce solid foods to your baby at around 6 months of age. BREAST MILK PUMPING Pumping and storing breast milk allows you to ensure that your baby is exclusively fed your breast milk, even at times when you are unable to breastfeed. This is especially important if you are going back to work while you are still breastfeeding or when you are not able to be present during feedings. Your lactation consultant can give you guidelines on how long it is safe to store breast milk. A breast pump is a machine that allows you to pump milk from your breast into a sterile bottle. The pumped breast milk can then be stored in a refrigerator or freezer. Some breast pumps are operated by hand, while others use electricity. Ask your lactation consultant which type will work best for you. Breast pumps can be purchased, but some hospitals and breastfeeding support groups lease breast pumps on a monthly basis. A lactation consultant can teach you how to hand express breast milk, if you prefer not to use a pump. CARING FOR YOUR BREASTS WHILE YOU BREASTFEED Nipples can become dry, cracked, and sore while breastfeeding. The following recommendations can help keep your breasts moisturized and healthy:  Avoid using soap on your nipples.  Wear a supportive bra. Although not required, special nursing bras and tank tops are designed to allow access to your   breasts for breastfeeding without taking off your entire bra or top. Avoid wearing underwire-style bras or extremely tight bras.  Air dry your nipples for 3-4minutes after each feeding.  Use only cotton bra pads to absorb leaked breast milk. Leaking of breast milk between feedings  is normal.  Use lanolin on your nipples after breastfeeding. Lanolin helps to maintain your skin's normal moisture barrier. If you use pure lanolin, you do not need to wash it off before feeding your baby again. Pure lanolin is not toxic to your baby. You may also hand express a few drops of breast milk and gently massage that milk into your nipples and allow the milk to air dry. In the first few weeks after giving birth, some women experience extremely full breasts (engorgement). Engorgement can make your breasts feel heavy, warm, and tender to the touch. Engorgement peaks within 3-5 days after you give birth. The following recommendations can help ease engorgement:  Completely empty your breasts while breastfeeding or pumping. You may want to start by applying warm, moist heat (in the shower or with warm water-soaked hand towels) just before feeding or pumping. This increases circulation and helps the milk flow. If your baby does not completely empty your breasts while breastfeeding, pump any extra milk after he or she is finished.  Wear a snug bra (nursing or regular) or tank top for 1-2 days to signal your body to slightly decrease milk production.  Apply ice packs to your breasts, unless this is too uncomfortable for you.  Make sure that your baby is latched on and positioned properly while breastfeeding. If engorgement persists after 48 hours of following these recommendations, contact your health care provider or a lactation consultant. OVERALL HEALTH CARE RECOMMENDATIONS WHILE BREASTFEEDING  Eat healthy foods. Alternate between meals and snacks, eating 3 of each per day. Because what you eat affects your breast milk, some of the foods may make your baby more irritable than usual. Avoid eating these foods if you are sure that they are negatively affecting your baby.  Drink milk, fruit juice, and water to satisfy your thirst (about 10 glasses a day).  Rest often, relax, and continue to take  your prenatal vitamins to prevent fatigue, stress, and anemia.  Continue breast self-awareness checks.  Avoid chewing and smoking tobacco. Chemicals from cigarettes that pass into breast milk and exposure to secondhand smoke may harm your baby.  Avoid alcohol and drug use, including marijuana. Some medicines that may be harmful to your baby can pass through breast milk. It is important to ask your health care provider before taking any medicine, including all over-the-counter and prescription medicine as well as vitamin and herbal supplements. It is possible to become pregnant while breastfeeding. If birth control is desired, ask your health care provider about options that will be safe for your baby. SEEK MEDICAL CARE IF:  You feel like you want to stop breastfeeding or have become frustrated with breastfeeding.  You have painful breasts or nipples.  Your nipples are cracked or bleeding.  Your breasts are red, tender, or warm.  You have a swollen area on either breast.  You have a fever or chills.  You have nausea or vomiting.  You have drainage other than breast milk from your nipples.  Your breasts do not become full before feedings by the fifth day after you give birth.  You feel sad and depressed.  Your baby is too sleepy to eat well.  Your baby is having trouble sleeping.     Your baby is wetting less than 3 diapers in a 24-hour period.  Your baby has less than 3 stools in a 24-hour period.  Your baby's skin or the white part of his or her eyes becomes yellow.   Your baby is not gaining weight by 5 days of age. SEEK IMMEDIATE MEDICAL CARE IF:  Your baby is overly tired (lethargic) and does not want to wake up and feed.  Your baby develops an unexplained fever.   This information is not intended to replace advice given to you by your health care provider. Make sure you discuss any questions you have with your health care provider.   Document Released: 06/01/2005  Document Revised: 02/20/2015 Document Reviewed: 11/23/2012 Elsevier Interactive Patient Education 2016 [employer]  

## 2015-06-13 NOTE — Progress Notes (Signed)
Subjective:  Meghan Moreno is a 19 y.o. G1P0 at 5121w3d being seen today for ongoing prenatal care.  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  Patient reports no complaints.  Contractions: Not present. Vag. Bleeding: None.  Movement: Present. Denies leaking of fluid.   The following portions of the patient's history were reviewed and updated as appropriate: allergies, current medications, past family history, past medical history, past social history, past surgical history and problem list. Problem list updated.  Objective:   Filed Vitals:   06/13/15 1511  BP: 120/79  Pulse: 105  Weight: 141 lb (63.957 kg)    Fetal Status: Fetal Heart Rate (bpm): 141 Fundal Height: 30 cm Movement: Present     General:  Alert, oriented and cooperative. Patient is in no acute distress.  Skin: Skin is warm and dry. No rash noted.   Cardiovascular: Normal heart rate noted  Respiratory: Normal respiratory effort, no problems with respiration noted  Abdomen: Soft, gravid, appropriate for gestational age. Pain/Pressure: Absent     Pelvic: Vag. Bleeding: None Vag D/C Character: Thin   Cervical exam deferred        Extremities: Normal range of motion.  Edema: None  Mental Status: Normal mood and affect. Normal behavior. Normal judgment and thought content.   Urinalysis: Urine Protein: Negative Urine Glucose: Negative  Assessment and Plan:  Pregnancy: G1P0 at 4421w3d  1. Encounter for supervision of normal first pregnancy in second trimester Continue routine prenatal care.   Preterm labor symptoms and general obstetric precautions including but not limited to vaginal bleeding, contractions, leaking of fluid and fetal movement were reviewed in detail with the patient. Please refer to  After Visit Summary for other counseling recommendations.  No Follow-up on file.   Reva Boresanya S Yamilka Lopiccolo, MD

## 2015-06-13 NOTE — Progress Notes (Signed)
BABYSCRIPTS PATIENT: [ ] initial, [ ] 12, [ ] 20, [ ] 28, [x ] 32, [ ] 36, [ ] 38, [ ] 39, [ ] 40 

## 2015-06-16 NOTE — L&D Delivery Note (Signed)
Delivery Note After a 15 minute 2nd stage, At 7:53 PM a viable female was delivered via  (Presentation:LOA  ).  APGAR: 8/9 ; weight  Pending. After 2 minutes, the cord was clamped and cut. 40 units of pitocin diluted in 1000cc LR was infused rapidly IV.  The placenta separated spontaneously and delivered via CCT and maternal pushing effort.  It was inspected and appears to be intact with a 3 VC.  Marland Kitchen    Anesthesia: Epidural  Episiotomy:  none Lacerations:  Bilateral 1st degree labial minora Suture Repair: 4-0 rapide Est. Blood Loss (mL):  50  Mom to postpartum.  Baby to Couplet care / Skin to Skin   All of the above by K..Gunadasa, MD under my direct supervision.   CRESENZO-DISHMAN,Topanga Alvelo 07/25/2015, 8:08 PM

## 2015-07-11 ENCOUNTER — Encounter: Payer: Self-pay | Admitting: *Deleted

## 2015-07-11 ENCOUNTER — Other Ambulatory Visit: Payer: Self-pay | Admitting: Obstetrics & Gynecology

## 2015-07-11 ENCOUNTER — Ambulatory Visit (INDEPENDENT_AMBULATORY_CARE_PROVIDER_SITE_OTHER): Payer: Medicaid Other | Admitting: Obstetrics & Gynecology

## 2015-07-11 VITALS — BP 121/72 | HR 112 | Wt 145.0 lb

## 2015-07-11 DIAGNOSIS — Z36 Encounter for antenatal screening of mother: Secondary | ICD-10-CM | POA: Diagnosis not present

## 2015-07-11 DIAGNOSIS — Z3493 Encounter for supervision of normal pregnancy, unspecified, third trimester: Secondary | ICD-10-CM

## 2015-07-11 DIAGNOSIS — Z3403 Encounter for supervision of normal first pregnancy, third trimester: Secondary | ICD-10-CM

## 2015-07-11 NOTE — Progress Notes (Signed)
Subjective:  Meghan Moreno is a 20 y.o. G1P0 at [redacted]w[redacted]d being seen today for ongoing prenatal care.  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  Patient reports no complaints.  Contractions: Irritability. Vag. Bleeding: None.  Movement: Present. Denies leaking of fluid.   The following portions of the patient's history were reviewed and updated as appropriate: allergies, current medications, past family history, past medical history, past social history, past surgical history and problem list. Problem list updated.  Objective:   Filed Vitals:   07/11/15 1509  BP: 121/72  Pulse: 112  Weight: 145 lb (65.772 kg)    Fetal Status:     Movement: Present     General:  Alert, oriented and cooperative. Patient is in no acute distress.  Skin: Skin is warm and dry. No rash noted.   Cardiovascular: Normal heart rate noted  Respiratory: Normal respiratory effort, no problems with respiration noted  Abdomen: Soft, gravid, appropriate for gestational age. Pain/Pressure: Absent     Pelvic: Vag. Bleeding: None Vag D/C Character: Thin   Cervical exam performed        Extremities: Normal range of motion.  Edema: Trace  Mental Status: Normal mood and affect. Normal behavior. Normal judgment and thought content.   Urinalysis: Urine Protein: Negative Urine Glucose: 2+  Assessment and Plan:  Pregnancy: G1P0 at [redacted]w[redacted]d  1. Normal pregnancy, third trimester - Culture, beta strep (group b only) - GC/chlamydia probe amp, urine   Term labor symptoms and general obstetric precautions including but not limited to vaginal bleeding, contractions, leaking of fluid and fetal movement were reviewed in detail with the patient. Please refer to After Visit Summary for other counseling  recommendations.  No Follow-up on file.   Lesly Dukes, MD

## 2015-07-12 LAB — GC/CHLAMYDIA PROBE AMP
CT Probe RNA: NOT DETECTED
GC PROBE AMP APTIMA: NOT DETECTED

## 2015-07-12 LAB — CULTURE, BETA STREP (GROUP B ONLY)

## 2015-07-24 ENCOUNTER — Encounter (HOSPITAL_COMMUNITY): Payer: Self-pay | Admitting: *Deleted

## 2015-07-24 ENCOUNTER — Inpatient Hospital Stay (HOSPITAL_COMMUNITY)
Admission: AD | Admit: 2015-07-24 | Discharge: 2015-07-27 | DRG: 775 | Disposition: A | Discharge status: Home / Self Care | Payer: Medicaid Other | Source: Ambulatory Visit | Attending: Obstetrics & Gynecology | Admitting: Obstetrics & Gynecology

## 2015-07-24 DIAGNOSIS — Z823 Family history of stroke: Secondary | ICD-10-CM | POA: Diagnosis not present

## 2015-07-24 DIAGNOSIS — Z833 Family history of diabetes mellitus: Secondary | ICD-10-CM | POA: Diagnosis not present

## 2015-07-24 DIAGNOSIS — Z141 Cystic fibrosis carrier: Secondary | ICD-10-CM

## 2015-07-24 DIAGNOSIS — Z806 Family history of leukemia: Secondary | ICD-10-CM

## 2015-07-24 DIAGNOSIS — Z3A38 38 weeks gestation of pregnancy: Secondary | ICD-10-CM

## 2015-07-24 DIAGNOSIS — O99824 Streptococcus B carrier state complicating childbirth: Secondary | ICD-10-CM | POA: Diagnosis present

## 2015-07-24 DIAGNOSIS — O4292 Full-term premature rupture of membranes, unspecified as to length of time between rupture and onset of labor: Principal | ICD-10-CM | POA: Diagnosis present

## 2015-07-24 DIAGNOSIS — O4290 Premature rupture of membranes, unspecified as to length of time between rupture and onset of labor, unspecified weeks of gestation: Secondary | ICD-10-CM | POA: Diagnosis present

## 2015-07-24 DIAGNOSIS — O09891 Supervision of other high risk pregnancies, first trimester: Secondary | ICD-10-CM

## 2015-07-24 DIAGNOSIS — Z87891 Personal history of nicotine dependence: Secondary | ICD-10-CM

## 2015-07-24 LAB — POCT FERN TEST: POCT FERN TEST: POSITIVE

## 2015-07-24 MED ORDER — LACTATED RINGERS IV SOLN
INTRAVENOUS | Status: DC
  Administered 2015-07-25 (×2): via INTRAVENOUS

## 2015-07-24 MED ORDER — OXYTOCIN BOLUS FROM INFUSION
500.0000 mL | INTRAVENOUS | Status: DC
  Administered 2015-07-25: 500 mL via INTRAVENOUS

## 2015-07-24 MED ORDER — LACTATED RINGERS IV SOLN
500.0000 mL | INTRAVENOUS | Status: DC | PRN
  Administered 2015-07-25: 500 mL via INTRAVENOUS

## 2015-07-24 MED ORDER — ONDANSETRON HCL 4 MG/2ML IJ SOLN
4.0000 mg | Freq: Four times a day (QID) | INTRAMUSCULAR | Status: DC | PRN
  Administered 2015-07-25 (×2): 4 mg via INTRAVENOUS
  Filled 2015-07-24 (×2): qty 2

## 2015-07-24 MED ORDER — OXYTOCIN 10 UNIT/ML IJ SOLN: 2.5000 [IU]/h | INTRAVENOUS | Status: DC

## 2015-07-24 MED ORDER — OXYCODONE-ACETAMINOPHEN 5-325 MG PO TABS: 1.0000 | ORAL_TABLET | ORAL | Status: DC | PRN

## 2015-07-24 MED ORDER — DEXTROSE 5 % IV SOLN
5.0000 10*6.[IU] | Freq: Once | INTRAVENOUS | Status: AC
  Administered 2015-07-25: 5 10*6.[IU] via INTRAVENOUS
  Filled 2015-07-24: qty 5

## 2015-07-24 MED ORDER — CITRIC ACID-SODIUM CITRATE 334-500 MG/5ML PO SOLN: 30.0000 mL | ORAL | Status: DC | PRN

## 2015-07-24 MED ORDER — OXYCODONE-ACETAMINOPHEN 5-325 MG PO TABS
2.0000 | ORAL_TABLET | ORAL | Status: DC | PRN
Start: 2015-07-24 — End: 2015-07-25

## 2015-07-24 MED ORDER — ACETAMINOPHEN 325 MG PO TABS: 650.0000 mg | ORAL_TABLET | ORAL | Status: DC | PRN

## 2015-07-24 MED ORDER — PENICILLIN G POTASSIUM 5000000 UNITS IJ SOLR
2.5000 10*6.[IU] | INTRAVENOUS | Status: DC
  Administered 2015-07-25 (×4): 2.5 10*6.[IU] via INTRAVENOUS
  Filled 2015-07-24 (×6): qty 2.5

## 2015-07-24 MED ORDER — ZOLPIDEM TARTRATE 5 MG PO TABS: 5.0000 mg | ORAL_TABLET | Freq: Every evening | ORAL | Status: DC | PRN

## 2015-07-24 MED ORDER — LIDOCAINE HCL (PF) 1 % IJ SOLN
30.0000 mL | INTRAMUSCULAR | Status: DC | PRN
  Filled 2015-07-24: qty 30

## 2015-07-24 MED ORDER — FENTANYL CITRATE (PF) 100 MCG/2ML IJ SOLN
100.0000 ug | INTRAMUSCULAR | Status: DC | PRN
  Administered 2015-07-25 (×8): 100 ug via INTRAVENOUS
  Filled 2015-07-24 (×8): qty 2

## 2015-07-24 NOTE — MAU Note (Signed)
Pt reports SROM at 2130. Denies pain. Reports + FM

## 2015-07-24 NOTE — MAU Note (Signed)
Pt reports ROM at 2130, lower abd and lower back pain.

## 2015-07-24 NOTE — H&P (Signed)
Meghan Moreno is a 20 y.o. female G1 @ 38.2wks by LMP and confirmed by 8wk scan presenting for leaking fluid since 2130 on 07/24/15. Having mild, irreg ctx since then. Denies bleeding. No H/A, N/V or fever. Her preg has been followed by the [facility] office and has been remarkable for 1) CF carrier w/ FOB CF status unknown (FOB uninvolved w/ preg) 2) GBS pos  History OB History    Gravida Para Term Preterm AB TAB SAB Ectopic Multiple Living   1              Past Medical History  Diagnosis Date  . Medical history non-contributory    History reviewed. No pertinent past surgical history. Family History: family history includes Diabetes in her father; Leukemia in her mother; Stroke in her maternal grandmother. Social History:  reports that she has quit smoking. Her smoking use included Cigarettes. She has a .15 pack-year smoking history. She does not have any smokeless tobacco history on file. She reports that she does not drink alcohol or use illicit drugs.   Prenatal Transfer Tool  Maternal Diabetes: No Genetic Screening: Declined Maternal Ultrasounds/Referrals: Normal Fetal Ultrasounds or other Referrals:  None Maternal Substance Abuse:  No Significant Maternal Medications:  None Significant Maternal Lab Results:  Lab values include: Group B Strep positive Other Comments:  None  ROS  Dilation: 1 Effacement (%): 70 Station: -2 Exam by:: B Mosca Blood pressure 124/79, pulse 103, temperature 97.6 F (36.4 C), temperature source Oral, resp. rate 16, height  (1.6 m), weight 67.132 kg (148 lb), last menstrual period 10/29/2014, SpO2 97 %. Exam Physical Exam  Constitutional: She is oriented to person, place, and time. She appears well-developed.  HENT:  Head: Normocephalic.  Neck: Normal range of motion.  Cardiovascular: Normal rate.   Respiratory: Effort normal.  GI:  EFM 120s, +accels, no decels Ctx q 2-5 mins, 40-60sec, mild  Genitourinary:  Fern pos slide per  RN  Musculoskeletal: Normal range of motion.  Neurological: She is alert and oriented to person, place, and time.  Skin: Skin is warm and dry.  Psychiatric: She has a normal mood and affect. Her behavior is normal. Thought content normal.    Prenatal labs: ABO, Rh: AB/POS/-- (07/15 0942) Antibody: NEG (07/15 0942) Rubella: 12.30 (07/15 0942) RPR: NON REAC (12/02 1200)  HBsAg: NEGATIVE (07/15 0942)  HIV: NONREACTIVE (12/02 1200)  GBS:   pos  Assessment/Plan: IUP@term  PROM GBS pos  Admit to [employer] [facility] for GBS ppx Consider cx ripening after managing expectantly initially Anticipate SVD   Carlo Guevarra CNM 07/24/2015, 11:52 PM

## 2015-07-25 ENCOUNTER — Encounter (HOSPITAL_COMMUNITY): Payer: Self-pay

## 2015-07-25 ENCOUNTER — Encounter: Payer: Medicaid Other | Admitting: Obstetrics & Gynecology

## 2015-07-25 ENCOUNTER — Inpatient Hospital Stay (HOSPITAL_COMMUNITY): Payer: Medicaid Other | Admitting: Anesthesiology

## 2015-07-25 ENCOUNTER — Encounter (HOSPITAL_COMMUNITY): Payer: Self-pay | Admitting: Certified Registered Nurse Anesthetist

## 2015-07-25 DIAGNOSIS — Z3A38 38 weeks gestation of pregnancy: Secondary | ICD-10-CM

## 2015-07-25 DIAGNOSIS — O4292 Full-term premature rupture of membranes, unspecified as to length of time between rupture and onset of labor: Secondary | ICD-10-CM

## 2015-07-25 DIAGNOSIS — O99824 Streptococcus B carrier state complicating childbirth: Secondary | ICD-10-CM

## 2015-07-25 LAB — CBC
HEMATOCRIT: 33.2 % — AB (ref 36.0–46.0)
Hemoglobin: 11.3 g/dL — ABNORMAL LOW (ref 12.0–15.0)
MCH: 28.5 pg (ref 26.0–34.0)
MCHC: 34 g/dL (ref 30.0–36.0)
MCV: 83.6 fL (ref 78.0–100.0)
Platelets: 245 10*3/uL (ref 150–400)
RBC: 3.97 MIL/uL (ref 3.87–5.11)
RDW: 13.6 % (ref 11.5–15.5)
WBC: 19.8 10*3/uL — AB (ref 4.0–10.5)

## 2015-07-25 LAB — TYPE AND SCREEN
ABO/RH(D): AB POS
Antibody Screen: NEGATIVE

## 2015-07-25 LAB — ABO/RH: ABO/RH(D): AB POS

## 2015-07-25 LAB — RPR: RPR: NONREACTIVE

## 2015-07-25 MED ORDER — TERBUTALINE SULFATE 1 MG/ML IJ SOLN
0.2500 mg | Freq: Once | INTRAMUSCULAR | Status: DC | PRN
  Filled 2015-07-25: qty 1

## 2015-07-25 MED ORDER — LIDOCAINE HCL (PF) 1 % IJ SOLN
INTRAMUSCULAR | Status: DC | PRN
  Administered 2015-07-25: 3 mL via EPIDURAL
  Administered 2015-07-25: 2 mL via EPIDURAL
  Administered 2015-07-25: 5 mL via EPIDURAL

## 2015-07-25 MED ORDER — BENZOCAINE-MENTHOL 20-0.5 % EX AERO
1.0000 "application " | INHALATION_SPRAY | CUTANEOUS | Status: DC | PRN
  Administered 2015-07-25: 1 via TOPICAL
  Filled 2015-07-25: qty 56

## 2015-07-25 MED ORDER — ONDANSETRON HCL 4 MG PO TABS: 4.0000 mg | ORAL_TABLET | ORAL | Status: DC | PRN

## 2015-07-25 MED ORDER — METHYLERGONOVINE MALEATE 0.2 MG PO TABS: 0.2000 mg | ORAL_TABLET | ORAL | Status: DC | PRN

## 2015-07-25 MED ORDER — LANOLIN HYDROUS EX OINT: TOPICAL_OINTMENT | CUTANEOUS | Status: DC | PRN

## 2015-07-25 MED ORDER — MISOPROSTOL 50MCG HALF TABLET
50.0000 ug | ORAL_TABLET | Freq: Once | ORAL | Status: AC
  Administered 2015-07-25: 50 ug via ORAL
  Filled 2015-07-25: qty 0.5

## 2015-07-25 MED ORDER — METHYLERGONOVINE MALEATE 0.2 MG/ML IJ SOLN: 0.2000 mg | INTRAMUSCULAR | Status: DC | PRN

## 2015-07-25 MED ORDER — FERROUS SULFATE 325 (65 FE) MG PO TABS
325.0000 mg | ORAL_TABLET | Freq: Two times a day (BID) | ORAL | Status: DC
  Administered 2015-07-26 – 2015-07-27 (×3): 325 mg via ORAL
  Filled 2015-07-25 (×3): qty 1

## 2015-07-25 MED ORDER — ACETAMINOPHEN 325 MG PO TABS
650.0000 mg | ORAL_TABLET | ORAL | Status: DC | PRN
  Administered 2015-07-26 – 2015-07-27 (×4): 650 mg via ORAL
  Filled 2015-07-25 (×4): qty 2

## 2015-07-25 MED ORDER — PRENATAL MULTIVITAMIN CH: 1.0000 | ORAL_TABLET | Freq: Every day | ORAL | Status: DC

## 2015-07-25 MED ORDER — WITCH HAZEL-GLYCERIN EX PADS: 1.0000 "application " | MEDICATED_PAD | CUTANEOUS | Status: DC | PRN

## 2015-07-25 MED ORDER — FLEET ENEMA 7-19 GM/118ML RE ENEM: 1.0000 | ENEMA | Freq: Every day | RECTAL | Status: DC | PRN

## 2015-07-25 MED ORDER — FENTANYL 2.5 MCG/ML BUPIVACAINE 1/10 % EPIDURAL INFUSION (WH - ANES)
14.0000 mL/h | INTRAMUSCULAR | Status: DC | PRN
  Administered 2015-07-25: 14 mL/h via EPIDURAL
  Filled 2015-07-25: qty 125

## 2015-07-25 MED ORDER — SIMETHICONE 80 MG PO CHEW: 80.0000 mg | CHEWABLE_TABLET | ORAL | Status: DC | PRN

## 2015-07-25 MED ORDER — DIPHENHYDRAMINE HCL 50 MG/ML IJ SOLN: 12.5000 mg | INTRAMUSCULAR | Status: DC | PRN

## 2015-07-25 MED ORDER — ZOLPIDEM TARTRATE 5 MG PO TABS: 5.0000 mg | ORAL_TABLET | Freq: Every evening | ORAL | Status: DC | PRN

## 2015-07-25 MED ORDER — EPHEDRINE 5 MG/ML INJ
10.0000 mg | INTRAVENOUS | Status: DC | PRN
  Filled 2015-07-25: qty 2

## 2015-07-25 MED ORDER — DIBUCAINE 1 % RE OINT: 1.0000 "application " | TOPICAL_OINTMENT | RECTAL | Status: DC | PRN

## 2015-07-25 MED ORDER — ONDANSETRON HCL 4 MG/2ML IJ SOLN: 4.0000 mg | INTRAMUSCULAR | Status: DC | PRN

## 2015-07-25 MED ORDER — PHENYLEPHRINE 40 MCG/ML (10ML) SYRINGE FOR IV PUSH (FOR BLOOD PRESSURE SUPPORT)
80.0000 ug | PREFILLED_SYRINGE | INTRAVENOUS | Status: DC | PRN
  Filled 2015-07-25: qty 2
  Filled 2015-07-25: qty 20

## 2015-07-25 MED ORDER — DIPHENHYDRAMINE HCL 25 MG PO CAPS: 25.0000 mg | ORAL_CAPSULE | Freq: Four times a day (QID) | ORAL | Status: DC | PRN

## 2015-07-25 MED ORDER — TETANUS-DIPHTH-ACELL PERTUSSIS 5-2.5-18.5 LF-MCG/0.5 IM SUSP: 0.5000 mL | Freq: Once | INTRAMUSCULAR | Status: DC

## 2015-07-25 MED ORDER — PRENATAL MULTIVITAMIN CH
1.0000 | ORAL_TABLET | Freq: Every day | ORAL | Status: DC
  Administered 2015-07-26 – 2015-07-27 (×2): 1 via ORAL
  Filled 2015-07-25 (×2): qty 1

## 2015-07-25 MED ORDER — SENNOSIDES-DOCUSATE SODIUM 8.6-50 MG PO TABS
2.0000 | ORAL_TABLET | ORAL | Status: DC
  Administered 2015-07-25 – 2015-07-26 (×2): 2 via ORAL
  Filled 2015-07-25 (×2): qty 2

## 2015-07-25 MED ORDER — IBUPROFEN 600 MG PO TABS
600.0000 mg | ORAL_TABLET | Freq: Four times a day (QID) | ORAL | Status: DC
  Administered 2015-07-25 – 2015-07-27 (×7): 600 mg via ORAL
  Filled 2015-07-25 (×7): qty 1

## 2015-07-25 MED ORDER — OXYTOCIN 10 UNIT/ML IJ SOLN
1.0000 m[IU]/min | INTRAVENOUS | Status: DC
  Administered 2015-07-25: 2 m[IU]/min via INTRAVENOUS
  Filled 2015-07-25: qty 4

## 2015-07-25 MED ORDER — BISACODYL 10 MG RE SUPP: 10.0000 mg | Freq: Every day | RECTAL | Status: DC | PRN

## 2015-07-25 MED ORDER — MEASLES, MUMPS & RUBELLA VAC ~~LOC~~ INJ: 0.5000 mL | INJECTION | Freq: Once | SUBCUTANEOUS | Status: DC

## 2015-07-25 NOTE — Anesthesia Procedure Notes (Signed)
Epidural Patient location during procedure: OB  Staffing Anesthesiologist: TURK, STEPHEN EDWARD Performed by: anesthesiologist   Preanesthetic Checklist Completed: patient identified, pre-op evaluation, timeout performed, IV checked, risks and benefits discussed and monitors and equipment checked  Epidural Patient position: sitting Prep: DuraPrep Patient monitoring: blood pressure and continuous pulse ox Approach: midline Location: L3-L4 Injection technique: LOR air  Needle:  Needle type: Tuohy  Needle gauge: 17 G Needle length: 9 cm Needle insertion depth: 4.5 cm Catheter size: 19 Gauge Catheter at skin depth: 9.5 cm Test dose: negative and Other (1% Lidocaine)  Additional Notes Patient identified.  Risk benefits discussed including failed block, incomplete pain control, headache, nerve damage, paralysis, blood pressure changes, nausea, vomiting, reactions to medication both toxic or allergic, and postpartum back pain.  Patient expressed understanding and wished to proceed.  All questions were answered.  Sterile technique used throughout procedure and epidural site dressed with sterile barrier dressing. No paresthesia or other complications noted. The patient did not experience any signs of intravascular injection such as tinnitus or metallic taste in mouth nor signs of intrathecal spread such as rapid motor block. Please see nursing notes for vital signs. Reason for block:procedure for pain   

## 2015-07-25 NOTE — Progress Notes (Signed)
Carrell Makenzie Weisner is a 20 y.o. G1P0 at 104w3d admitted for rupture of membranes  Subjective: Feels discomfort intermittently  Objective: BP 125/78 mmHg  Pulse 98  Temp(Src) 98.4 F (36.9 C) (Oral)  Resp 20  Ht  (1.6 m)  Wt 63.504 kg (140 lb)  BMI 24.81 kg/m2  SpO2 97%  LMP 10/29/2014      FHT:  FHR: 115 bpm, variability: moderate,  accelerations:  Present,  decelerations:  Absent UC:   irregular, every 2-4 minutes SVE:   Dilation: 1 Effacement (%): 70 Station: -2 Exam by:: B Mosca  Labs: Lab Results  Component Value Date   WBC 19.8* 07/25/2015   HGB 11.3* 07/25/2015   HCT 33.2* 07/25/2015   MCV 83.6 07/25/2015   PLT 245 07/25/2015    Assessment / Plan: IUP@38 .3wks PROM GBS pos  Cytotec PO given for ripening  Nakira Litzau CNM 07/25/2015, 5:41 AM

## 2015-07-25 NOTE — Progress Notes (Signed)
   Meghan Moreno is a 20 y.o. G1P0 at [redacted]w[redacted]d  admitted for PROM  Subjective:  Has one hot spot with epidural. No pressure Objective: Filed Vitals:   07/25/15 1805 07/25/15 1810 07/25/15 1814 07/25/15 1820  BP: 115/68 117/74 112/67 109/62  Pulse: 113 109 104 105  Temp:      TempSrc:      Resp:  18    Height:      Weight:      SpO2:          FHT:  FHR: 120 bpm, variability: moderate,  accelerations:  Present,  decelerations:  Present occ mild variable UC:   regular, every 2-3 minutes SVE:   Dilation: Lip/rim Effacement (%): 100 Station: -1 Exam by:: fran, cnm Pitocin @ 3 mu/min  Labs: Lab Results  Component Value Date   WBC 19.8* 07/25/2015   HGB 11.3* 07/25/2015   HCT 33.2* 07/25/2015   MCV 83.6 07/25/2015   PLT 245 07/25/2015    Assessment / Plan: Augmentation of labor, progressing well  Labor: Progressing normally  Will labor down until urge to push Fetal Wellbeing:  Category I Pain Control:  Epidural Anticipated MOD:  NSVD  CRESENZO-DISHMAN,Meghan Moreno 07/25/2015, 6:54 PM

## 2015-07-25 NOTE — Progress Notes (Signed)
Labor Progress Note  Meghan Moreno is a 20 y.o. G1P0 at [redacted]w[redacted]d  admitted for PROM @ 2130 2/8  S: Doing well no concerns. Pain is controlled with PRN Fentanyl   O:  BP 109/62 mmHg  Pulse 89  Temp(Src) 98.1 F (36.7 C) (Oral)  Resp 16  Ht  (1.6 m)  Wt 63.504 kg (140 lb)  BMI 24.81 kg/m2  SpO2 97%  LMP 10/29/2014    FHT:  FHR: 120 bpm, variability: moderate,  accelerations:  Present,  decelerations:  Absent UC:  2.5-4.5 mins  SVE:   Dilation: 2.5 Effacement (%): 70 Station: -2, -1 Exam by:: Devra Dopp, RN  PROM @ 2130 2/8  Labs: Lab Results  Component Value Date   WBC 19.8* 07/25/2015   HGB 11.3* 07/25/2015   HCT 33.2* 07/25/2015   MCV 83.6 07/25/2015   PLT 245 07/25/2015    Assessment / Plan: 20 y.o. G1P0 [redacted]w[redacted]d admitted for PROM.  S/p PO cytotec x 1 .  Progressing normally  Labor: Progressing normally Fetal Wellbeing:  Category II one variable Pain Control:  Fentanyl Anticipated MOD:  NSVD   Palma Holter, MD PGY 1 Family Medicine

## 2015-07-25 NOTE — Anesthesia Preprocedure Evaluation (Signed)
Anesthesia Evaluation  Patient identified by MRN, date of birth, ID band Patient awake    Reviewed: Allergy & Precautions, NPO status , Patient's Chart, lab work & pertinent test results  Airway Mallampati: II  TM Distance: >3 FB Neck ROM: Full    Dental  (+) Teeth Intact, Dental Advisory Given   Pulmonary former smoker,    Pulmonary exam normal breath sounds clear to auscultation       Cardiovascular Exercise Tolerance: Good negative cardio ROS Normal cardiovascular exam Rhythm:Regular Rate:Normal     Neuro/Psych negative neurological ROS  negative psych ROS   GI/Hepatic negative GI ROS, Neg liver ROS,   Endo/Other  negative endocrine ROS  Renal/GU negative Renal ROS     Musculoskeletal negative musculoskeletal ROS (+)   Abdominal   Peds  Hematology  (+) Blood dyscrasia, anemia , Plt 245k   Anesthesia Other Findings Day of surgery medications reviewed with the patient.  Reproductive/Obstetrics (+) Pregnancy                             Anesthesia Physical Anesthesia Plan  ASA: II  Anesthesia Plan: Epidural   Post-op Pain Management:    Induction:   Airway Management Planned:   Additional Equipment:   Intra-op Plan:   Post-operative Plan:   Informed Consent: I have reviewed the patients History and Physical, chart, labs and discussed the procedure including the risks, benefits and alternatives for the proposed anesthesia with the patient or authorized representative who has indicated his/her understanding and acceptance.   Dental advisory given  Plan Discussed with:   Anesthesia Plan Comments: (Patient identified. Risks/Benefits/Options discussed with patient including but not limited to bleeding, infection, nerve damage, paralysis, failed block, incomplete pain control, headache, blood pressure changes, nausea, vomiting, reactions to medication both or allergic, itching  and postpartum back pain. Confirmed with bedside nurse the patient's most recent platelet count. Confirmed with patient that they are not currently taking any anticoagulation, have any bleeding history or any family history of bleeding disorders. Patient expressed understanding and wished to proceed. All questions were answered. )        Anesthesia Quick Evaluation

## 2015-07-26 MED ORDER — SENNOSIDES-DOCUSATE SODIUM 8.6-50 MG PO TABS: 2.0000 | ORAL_TABLET | ORAL | Status: DC

## 2015-07-26 MED ORDER — ACETAMINOPHEN 325 MG PO TABS: 650.0000 mg | ORAL_TABLET | ORAL | Status: DC | PRN

## 2015-07-26 MED ORDER — IBUPROFEN 600 MG PO TABS: 600.0000 mg | ORAL_TABLET | Freq: Four times a day (QID) | ORAL | Status: DC

## 2015-07-26 MED ORDER — DOCUSATE SODIUM 100 MG PO CAPS: 100.0000 mg | ORAL_CAPSULE | Freq: Two times a day (BID) | ORAL | Status: DC

## 2015-07-26 NOTE — Anesthesia Postprocedure Evaluation (Addendum)
Anesthesia Post Note  Patient: Meghan Moreno  Procedure(s) Performed: * No procedures listed *  Patient location during evaluation: Mother Baby Anesthesia Type: Epidural Level of consciousness: awake and alert and oriented Pain management: pain level controlled (patient states pain goals met.) Vital Signs Assessment: post-procedure vital signs reviewed and stable Respiratory status: spontaneous breathing Cardiovascular status: blood pressure returned to baseline Postop Assessment: no headache, patient able to bend at knees, no signs of nausea or vomiting and adequate PO intake Anesthetic complications: no    Last Vitals:  Filed Vitals:   07/26/15 0300 07/26/15 0500  BP: 105/68 95/56  Pulse: 91 84  Temp: 36.9 C 36.6 C  Resp: 16 16    Last Pain:  Filed Vitals:   07/26/15 0632  PainSc: Asleep                 Barth Trella

## 2015-07-26 NOTE — Clinical Social Work Maternal (Signed)
CLINICAL SOCIAL WORK MATERNAL/CHILD NOTE  Patient Details  Name: Meghan Moreno MRN: [id number] Date of Birth: Jul 11, 1995  Date:  07/26/2015  Clinical Social Worker Initiating Note:  Lucita Ferrara MSW, LCSW Date/ Time Initiated:  07/26/15/1030     Child's Name:  Meghan Moreno   Legal Guardian:  Mother   Need for Interpreter:  None   Date of Referral:  07/25/15     Reason for Referral: Young mother with flat affect  Referral Source:  [facility]   Address:  [address], [place] [address]  Phone number:  [phone]   Household Members:  Parents, Siblings   Natural Supports (not living in the home):  Immediate Family   Professional Supports: None   Employment: Ship broker (Early childhood development)   Type of Work:     Education:      Pensions consultant:  [employer]   Other Resources:  [employer]   Cultural/Religious Considerations Which May Impact Care:  None reported  Strengths:  Ability to meet basic needs , Home prepared for child , Pediatrician chosen    Risk Factors/Current Problems:  None   Cognitive State:  Able to Concentrate , Alert , Goal Oriented , Linear Thinking    Mood/Affect:  Happy , Bright , Calm , Comfortable    CSW Assessment:  CSW received request for consult due to pediatrician noting that [name] presented with a flattened affect.  [name]'s mother present in the room upon CSW arrival. [name] provided consent for CSW to engage in her presence.  [name] was observed to be smiling and attentive to the infant. [name] did not maintain eye contact with CSW as she was noted to be looking into the infant's eyes, holding the infant, and frequently commenting on the infant's gaze and grip.  [name] displayed a full range in affect and was in a pleasant mood.  [name] denied questions, concerns, or needs as she transitions postpartum. She stated that she did not have expectations for childbirth, and shared that she is pleased with the experience.  [name] shared that she will be formula feeding the infant, but has noted that the infant remains uninterested in feedings. She denied feeling alarmed since she has been informed that this is normal newborn behavior. [name] expressed feelings of happiness secondary to becoming a mother, and denied any anxieties secondary to role transition. She stated that she lives with her mother, father, and brother, and discussed awareness that she is not alone. [name] reported that she can be "stubborn", and finds it difficult to ask for help at times, but shared that she knows that it is there if needed. [name] recognized that it is unrealistic to never ask for help, and shared that she knows that it is okay to receive help as a new mother.  Per [name], the home is prepared for the infant, and all basic needs are met.  [name] stated that the FOB is not involved, but she denied his absence being a stressor.   [name] denied mental health complications during the pregnancy. After CSW provided education on perinatal mood and anxiety disorders, [name] stated that she was already aware of signs and symptoms since she is in school for early childhood development. She expressed feeling confident in her ability to monitor for symptoms.  [name] denied questions, concerns, or needs at this time, but agreed to contact CSW if additional needs arise.  CSW Plan/Description:   1. Patient/Family Education-- Perinatal mood and anxiety disorders 2. No Further Intervention Required/No  Barriers to Discharge    Sharyl Nimrod 07/26/2015, 10:50 AM

## 2015-07-26 NOTE — Progress Notes (Signed)
Post Partum Day 1  Subjective:  Meghan Moreno is a 20 y.o. G1P1001 [redacted]w[redacted]d s/p SVD.  No acute events overnight.  Pt denies problems with ambulating, voiding or po intake.  She denies nausea or vomiting.  Pain is well controlled. Lochia Minimal.   Plan for birth control is no method, abstinence.   Method of Feeding: bottle   Objective: BP 95/56 mmHg  Pulse 84  Temp(Src) 97.9 F (36.6 C) (Oral)  Resp 16  Ht  (1.6 m)  Wt 63.504 kg (140 lb)  BMI 24.81 kg/m2  SpO2 99%  LMP 10/29/2014  Breastfeeding? Unknown  Physical Exam:  General: alert, cooperative and no distress Lochia: normal flow Chest: CTAB Heart: RRR no m/r/g Abdomen: +BS, soft, nontender, fundus firm below umbilicus DVT Evaluation: No evidence of DVT seen on physical exam. Extremities: noedema   Recent Labs  07/25/15 0010  HGB 11.3*  HCT 33.2*    Assessment/Plan:  ASSESSMENT: Meghan Moreno is a 20 y.o. G1P1001 [redacted]w[redacted]d PPD #1 s/p NSVD doing well.   Plan for discharge tomorrow   LOS: 2 days   Amber Heckart 07/26/2015, 7:18 AM   Seen and examined by me also Agree with note Filed Vitals:   07/26/15 0300 07/26/15 0500  BP: 105/68 95/56  Pulse: 91 84  Temp: 98.4 F (36.9 C) 97.9 F (36.6 C)  Resp: 16 16   Fundus firm, lochia within normal limits  Plan discharge tomorrow Aviva Signs, CNM

## 2015-07-27 NOTE — Discharge Summary (Signed)
OB Discharge Summary     Patient Name: Meghan Moreno DOB: 01/05/1996 MRN: [id number]  Date of admission: 07/24/2015 Delivering MD: Palma Holter   Date of discharge: 07/27/2015  Admitting diagnosis: 37w water broke, PROM Intrauterine pregnancy: [redacted]w[redacted]d     Secondary diagnosis:  Active Problems:   Amniotic fluid leaking  Additional problems: none     Discharge diagnosis: Term Pregnancy Delivered                                                                                                Post partum procedures:none  Augmentation: Cytotec  Complications: None  Hospital course:  Onset of Labor With Vaginal Delivery     20 y.o. yo G1P1001 at [redacted]w[redacted]d was admitted in Latent Labor on 07/24/2015. Patient had an uncomplicated labor course as follows:  Membrane Rupture Time/Date: 9:30 PM ,07/24/2015   Intrapartum Procedures: Episiotomy: None [1]                                         Lacerations:  1st degree [2];Labial [10]  Patient had a delivery of a Viable infant. 07/25/2015  Information for the patient's newborn:  Francille, Wittmann [[id number]]  Delivery Method: Vaginal, Spontaneous Delivery (Filed from Delivery Summary)    Pateint had an uncomplicated postpartum course.  She is ambulating, tolerating a regular diet, passing flatus, and urinating well. Patient is discharged home in stable condition on 07/27/2015.    Physical exam  Filed Vitals:   07/26/15 0500 07/26/15 1200 07/26/15 1700 07/27/15 0506  BP: 95/56 111/55 109/84 92/57  Pulse: 84 87 91 80  Temp: 97.9 F (36.6 C) 98.5 F (36.9 C) 98.3 F (36.8 C) 99.2 F (37.3 C)  TempSrc: Oral Oral Oral Oral  Resp: Height:      Weight:      SpO2:       General: alert, cooperative and no distress Lochia: appropriate Uterine Fundus: firm Incision: N/A DVT Evaluation: No evidence of DVT seen on physical exam. No significant calf/ankle edema. Labs: Lab Results  Component Value Date   WBC 19.8*  07/25/2015   HGB 11.3* 07/25/2015   HCT 33.2* 07/25/2015   MCV 83.6 07/25/2015   PLT 245 07/25/2015   No flowsheet data found.  Discharge instruction: per After Visit Summary and "Baby and Me Booklet".  After visit meds:    Medication List    TAKE these medications        acetaminophen 325 MG tablet  Commonly known as:  TYLENOL  Take 2 tablets (650 mg total) by mouth every 4 (four) hours as needed (for pain scale < 4).     docusate sodium 100 MG capsule  Commonly known as:  COLACE  Take 1 capsule (100 mg total) by mouth 2 (two) times daily.     ibuprofen 600 MG tablet  Commonly known as:  ADVIL,MOTRIN  Take 1 tablet (600 mg total) by mouth every 6 (six) hours.  multivitamin-prenatal 27-0.8 MG Tabs tablet  Take 1 tablet by mouth daily at 12 noon.     senna-docusate 8.6-50 MG tablet  Commonly known as:  Senokot-S  Take 2 tablets by mouth daily.        Diet: routine diet  Activity: Advance as tolerated. Pelvic rest for 6 weeks.   Outpatient follow up:6 weeks Follow up Appt:No future appointments. Follow up Visit:No Follow-up on file. Follow-up Information    Follow up with [facility] [place]. Schedule an appointment as soon as possible for a visit in 6 weeks.   Specialty:  Obstetrics and Gynecology   Why:  post partum check   Contact information:   [address], [address] [place] [address] [phone]      Postpartum contraception: Abstinence and None  Newborn Data: Live born female  Birth Weight: 6 lb 12.5 oz (3075 g) APGAR: 8, 9  Baby Feeding: Bottle Disposition:home with mother   07/27/2015 Wynne Dust, MD  I was present for the exam and agree with above.  [place], CNM 07/27/2015 11:07 AM

## 2015-07-27 NOTE — Discharge Instructions (Signed)
Postpartum Care After Vaginal Delivery °After you deliver your newborn (postpartum period), the usual stay in the hospital is 24-72 hours. If there were problems with your labor or delivery, or if you have other medical problems, you might be in the hospital longer.  °While you are in the hospital, you will receive help and instructions on how to care for yourself and your newborn during the postpartum period.  °While you are in the hospital: °· Be sure to tell your nurses if you have pain or discomfort, as well as where you feel the pain and what makes the pain worse. °· If you had an incision made near your vagina (episiotomy) or if you had some tearing during delivery, the nurses may put ice packs on your episiotomy or tear. The ice packs may help to reduce the pain and swelling. °· If you are breastfeeding, you may feel uncomfortable contractions of your uterus for a couple of weeks. This is normal. The contractions help your uterus get back to normal size. °· It is normal to have some bleeding after delivery. °· For the first 1-3 days after delivery, the flow is red and the amount may be similar to a period. °· It is common for the flow to start and stop. °· In the first few days, you may pass some small clots. Let your nurses know if you begin to pass large clots or your flow increases. °· Do not  flush blood clots down the toilet before having the nurse look at them. °· During the next 3-10 days after delivery, your flow should become more watery and pink or brown-tinged in color. °· Ten to fourteen days after delivery, your flow should be a small amount of yellowish-white discharge. °· The amount of your flow will decrease over the first few weeks after delivery. Your flow may stop in 6-8 weeks. Most women have had their flow stop by 12 weeks after delivery. °· You should change your sanitary pads frequently. °· Wash your hands thoroughly with soap and water for at least 20 seconds after changing pads, using  the toilet, or before holding or feeding your newborn. °· You should feel like you need to empty your bladder within the first 6-8 hours after delivery. °· In case you become weak, lightheaded, or faint, call your nurse before you get out of bed for the first time and before you take a shower for the first time. °· Within the first few days after delivery, your breasts may begin to feel tender and full. This is called engorgement. Breast tenderness usually goes away within 48-72 hours after engorgement occurs. You may also notice milk leaking from your breasts. If you are not breastfeeding, do not stimulate your breasts. Breast stimulation can make your breasts produce more milk. °· Spending as much time as possible with your newborn is very important. During this time, you and your newborn can feel close and get to know each other. Having your newborn stay in your room (rooming in) will help to strengthen the bond with your newborn.  It will give you time to get to know your newborn and become comfortable caring for your newborn. °· Your hormones change after delivery. Sometimes the hormone changes can temporarily cause you to feel sad or tearful. These feelings should not last more than a few days. If these feelings last longer than that, you should talk to your caregiver. °· If desired, talk to your caregiver about methods of family planning or contraception. °·   Talk to your caregiver about immunizations. Your caregiver may want you to have the following immunizations before leaving the hospital:  Tetanus, diphtheria, and pertussis (Tdap) or tetanus and diphtheria (Td) immunization. It is very important that you and your family (including grandparents) or others caring for your newborn are up-to-date with the Tdap or Td immunizations. The Tdap or Td immunization can help protect your newborn from getting ill.  Rubella immunization.  Varicella (chickenpox) immunization.  Influenza immunization. You should  receive this annual immunization if you did not receive the immunization during your pregnancy.   This information is not intended to replace advice given to you by your health care provider. Make sure you discuss any questions you have with your health care provider.   Document Released: 03/29/2007 Document Revised: 02/24/2012 Document Reviewed: 01/27/2012 Elsevier Interactive Patient Education [employer]   Contraception Choices Contraception (birth control) is the use of any methods or devices to prevent pregnancy. Below are some methods to help avoid pregnancy. HORMONAL METHODS   Contraceptive implant. This is a thin, plastic tube containing progesterone hormone. It does not contain estrogen hormone. Your health care provider inserts the tube in the inner part of the upper arm. The tube can remain in place for up to 3 years. After 3 years, the implant must be removed. The implant prevents the ovaries from releasing an egg (ovulation), thickens the cervical mucus to prevent sperm from entering the uterus, and thins the lining of the inside of the uterus.  Progesterone-only injections. These injections are given every 3 months by your health care provider to prevent pregnancy. This synthetic progesterone hormone stops the ovaries from releasing eggs. It also thickens cervical mucus and changes the uterine lining. This makes it harder for sperm to survive in the uterus.  Birth control pills. These pills contain estrogen and progesterone hormone. They work by preventing the ovaries from releasing eggs (ovulation). They also cause the cervical mucus to thicken, preventing the sperm from entering the uterus. Birth control pills are prescribed by a health care provider.Birth control pills can also be used to treat heavy periods.  Minipill. This type of birth control pill contains only the progesterone hormone. They are taken every day of each month and must be prescribed by your health care  provider.  Birth control patch. The patch contains hormones similar to those in birth control pills. It must be changed once a week and is prescribed by a health care provider.  Vaginal ring. The ring contains hormones similar to those in birth control pills. It is left in the vagina for 3 weeks, removed for 1 week, and then a new one is put back in place. The patient must be comfortable inserting and removing the ring from the vagina.A health care provider's prescription is necessary.  Emergency contraception. Emergency contraceptives prevent pregnancy after unprotected sexual intercourse. This pill can be taken right after sex or up to 5 days after unprotected sex. It is most effective the sooner you take the pills after having sexual intercourse. Most emergency contraceptive pills are available without a prescription. Check with your pharmacist. Do not use emergency contraception as your only form of birth control. BARRIER METHODS   Female condom. This is a thin sheath (latex or rubber) that is worn over the penis during sexual intercourse. It can be used with spermicide to increase effectiveness.  Female condom. This is a soft, loose-fitting sheath that is put into the vagina before sexual intercourse.  Diaphragm. This is  soft, latex, dome-shaped barrier that must be fitted by a health care provider. It is inserted into the vagina, along with a spermicidal jelly. It is inserted before intercourse. The diaphragm should be left in the vagina for 6 to 8 hours after intercourse. °· Cervical cap. This is a round, soft, latex or plastic cup that fits over the cervix and must be fitted by a health care provider. The cap can be left in place for up to 48 hours after intercourse. °· Sponge. This is a soft, circular piece of polyurethane foam. The sponge has spermicide in it. It is inserted into the vagina after wetting it and before sexual intercourse. °· Spermicides. These are chemicals that kill or block  sperm from entering the cervix and uterus. They come in the form of creams, jellies, suppositories, foam, or tablets. They do not require a prescription. They are inserted into the vagina with an applicator before having sexual intercourse. The process must be repeated every time you have sexual intercourse. °INTRAUTERINE CONTRACEPTION °· Intrauterine device (IUD). This is a T-shaped device that is put in a woman's uterus during a menstrual period to prevent pregnancy. There are 2 types: °¨ Copper IUD. This type of IUD is wrapped in copper wire and is placed inside the uterus. Copper makes the uterus and fallopian tubes produce a fluid that kills sperm. It can stay in place for 10 years. °¨ Hormone IUD. This type of IUD contains the hormone progestin (synthetic progesterone). The hormone thickens the cervical mucus and prevents sperm from entering the uterus, and it also thins the uterine lining to prevent implantation of a fertilized egg. The hormone can weaken or kill the sperm that get into the uterus. It can stay in place for 3-5 years, depending on which type of IUD is used. °PERMANENT METHODS OF CONTRACEPTION °· Female tubal ligation. This is when the woman's fallopian tubes are surgically sealed, tied, or blocked to prevent the egg from traveling to the uterus. °· Hysteroscopic sterilization. This involves placing a small coil or insert into each fallopian tube. Your doctor uses a technique called hysteroscopy to do the procedure. The device causes scar tissue to form. This results in permanent blockage of the fallopian tubes, so the sperm cannot fertilize the egg. It takes about 3 months after the procedure for the tubes to become blocked. You must use another form of birth control for these 3 months. °· Female sterilization. This is when the female has the tubes that carry sperm tied off (vasectomy). This blocks sperm from entering the vagina during sexual intercourse. After the procedure, the man can still  ejaculate fluid (semen). °NATURAL PLANNING METHODS °· Natural family planning. This is not having sexual intercourse or using a barrier method (condom, diaphragm, cervical cap) on days the woman could become pregnant. °· Calendar method. This is keeping track of the length of each menstrual cycle and identifying when you are fertile. °· Ovulation method. This is avoiding sexual intercourse during ovulation. °· Symptothermal method. This is avoiding sexual intercourse during ovulation, using a thermometer and ovulation symptoms. °· Post-ovulation method. This is timing sexual intercourse after you have ovulated. °Regardless of which type or method of contraception you choose, it is important that you use condoms to protect against the transmission of sexually transmitted infections (STIs). Talk with your health care provider about which form of contraception is most appropriate for you. °  °This information is not intended to replace advice given to you by your health care provider.   Make sure you discuss any questions you have with your health care provider. °  °Document Released: 06/01/2005 Document Revised: 06/06/2013 Document Reviewed: 11/24/2012 °Elsevier Interactive Patient Education ©2016 [employer] ° °

## 2015-08-12 ENCOUNTER — Encounter: Payer: Self-pay | Admitting: Family Medicine

## 2015-08-12 ENCOUNTER — Ambulatory Visit (INDEPENDENT_AMBULATORY_CARE_PROVIDER_SITE_OTHER): Payer: Medicaid Other | Admitting: Family Medicine

## 2015-08-12 VITALS — BP 131/85 | HR 85 | Temp 98.4°F | Wt 136.0 lb

## 2015-08-12 DIAGNOSIS — J329 Chronic sinusitis, unspecified: Secondary | ICD-10-CM

## 2015-08-12 DIAGNOSIS — A499 Bacterial infection, unspecified: Secondary | ICD-10-CM

## 2015-08-12 DIAGNOSIS — B9689 Other specified bacterial agents as the cause of diseases classified elsewhere: Secondary | ICD-10-CM

## 2015-08-12 MED ORDER — AMOXICILLIN-POT CLAVULANATE 500-125 MG PO TABS: ORAL_TABLET | ORAL | Status: AC

## 2015-08-12 NOTE — Progress Notes (Signed)
CC: Meghan Moreno is a 20 y.o. female is here for URI   Subjective: HPI:  Facial pressure beneath the eyes, sore throat, postnasal drip and nasal congestion. Present for the past week. Seems to be worsening. No benefit from generic NyQuil. No other interventions as of yet. Starting to feel fatigue. Denies fevers, chills, cough, shortness of breath, headache or confusion. No rash   Review Of Systems Outlined In HPI  Past Medical History  Diagnosis Date  . Medical history non-contributory     No past surgical history on file. Family History  Problem Relation Age of Onset  . Leukemia Mother   . Stroke Maternal Grandmother     grandparent  . Diabetes Father     Social History   Social History  . Marital Status: Single    Spouse Name: N/A  . Number of Children: N/A  . Years of Education: N/A   Occupational History  . server    Social History Main Topics  . Smoking status: Former Smoker -- 0.30 packs/day for .5 years    Types: Cigarettes  . Smokeless tobacco: Not on file  . Alcohol Use: No  . Drug Use: No  . Sexual Activity:    Partners: Male    Birth Control/ Protection: None   Other Topics Concern  . Not on file   Social History Narrative     Objective: BP 131/85 mmHg  Pulse 85  Temp(Src) 98.4 F (36.9 C) (Oral)  Wt 136 lb (61.689 kg)  General: Alert and Oriented, No Acute Distress HEENT: Pupils equal, round, reactive to light. Conjunctivae clear.  External ears unremarkable, canals clear with intact TMs with appropriate landmarks.  Middle ear appears open without effusion. Pink inferior turbinates.  Moist mucous membranes, pharynx without inflammation nor lesionsother than postnasal drip.  Neck supple without palpable lymphadenopathy nor abnormal masses. Lungs: Clear to auscultation bilaterally, no wheezing/ronchi/rales.  Comfortable work of breathing. Good air movement. Extremities: No peripheral edema.  Strong peripheral pulses.  Mental Status: No  depression, anxiety, nor agitation. Skin: Warm and dry.  Assessment & Plan: Meghan Moreno was seen today for uri.  Diagnoses and all orders for this visit:  Bacterial sinusitis -     amoxicillin-clavulanate (AUGMENTIN) 500-125 MG tablet; Take one by mouth every 8 hours for ten total days.   Call if no better by Wednesday.  Return if symptoms worsen or fail to improve.

## 2015-08-22 ENCOUNTER — Encounter: Payer: Self-pay | Admitting: Obstetrics & Gynecology

## 2015-08-22 ENCOUNTER — Ambulatory Visit (INDEPENDENT_AMBULATORY_CARE_PROVIDER_SITE_OTHER): Payer: Medicaid Other | Admitting: Obstetrics & Gynecology

## 2015-08-22 ENCOUNTER — Other Ambulatory Visit (HOSPITAL_COMMUNITY)
Admission: RE | Admit: 2015-08-22 | Discharge: 2015-08-22 | Disposition: A | Discharge status: Home / Self Care | Payer: Medicaid Other | Source: Ambulatory Visit | Attending: Obstetrics & Gynecology | Admitting: Obstetrics & Gynecology

## 2015-08-22 DIAGNOSIS — Z30011 Encounter for initial prescription of contraceptive pills: Secondary | ICD-10-CM

## 2015-08-22 DIAGNOSIS — Z01419 Encounter for gynecological examination (general) (routine) without abnormal findings: Secondary | ICD-10-CM | POA: Insufficient documentation

## 2015-08-22 DIAGNOSIS — Z0000 Encounter for general adult medical examination without abnormal findings: Secondary | ICD-10-CM

## 2015-08-22 MED ORDER — NORGESTREL-ETHINYL ESTRADIOL 0.3-30 MG-MCG PO TABS: 1.0000 | ORAL_TABLET | Freq: Every day | ORAL | Status: AC

## 2015-08-22 NOTE — Progress Notes (Signed)
  Subjective:     Meghan Moreno is a 20 y.o. SW 45P1  female who presents for a postpartum visit. She is 4 weeks postpartum following a spontaneous vaginal delivery. I have fully reviewed the prenatal and intrapartum course. The delivery was at 38  gestational weeks. Outcome: spontaneous vaginal delivery. Anesthesia: epidural. Postpartum course has been normal. Baby's course has been normal. Baby is feeding by bottle - Enfamil AR. Bleeding no bleeding. Bowel function is normal. Bladder function is normal. Patient is not sexually active. Contraception method is OCP (estrogen/progesterone). Postpartum depression screening: negative.  The following portions of the patient's history were reviewed and updated as appropriate: allergies, current medications, past family history, past medical history, past social history, past surgical history and problem list.  Review of Systems Pertinent items are noted in HPI.   Objective:    BP 129/77 mmHg  Pulse 87  Resp 16  Ht 5\' 3"  (1.6 m)  Wt 139 lb (63.05 kg)  BMI 24.63 kg/m2  LMP 08/20/2015  Breastfeeding? No  General:  alert   Breasts:  inspection negative, no nipple discharge or bleeding, no masses or nodularity palpable  Lungs: clear to auscultation bilaterally  Heart:  regular rate and rhythm, S1, S2 normal, no murmur, click, rub or gallop  Abdomen: soft, non-tender; bowel sounds normal; no masses,  no organomegaly   Vulva:  normal  Vagina: normal vagina  Cervix:  anteverted  Corpus: normal  Adnexa:  no mass, fullness, tenderness  Rectal Exam: Not performed.        Assessment:     Normal postpartum exam. Pap smear done at today's visit.   Plan:    1. Contraception: OCP (estrogen/progesterone) 2. Rec back up method for a month 3. Follow up in: 3 months or as needed.   For a pill check

## 2015-08-26 LAB — CYTOLOGY - PAP

## 2015-08-29 ENCOUNTER — Ambulatory Visit: Payer: Medicaid Other | Admitting: Obstetrics & Gynecology

## 2016-03-31 ENCOUNTER — Encounter: Payer: Self-pay | Admitting: Family Medicine

## 2016-03-31 ENCOUNTER — Ambulatory Visit (INDEPENDENT_AMBULATORY_CARE_PROVIDER_SITE_OTHER): Payer: Medicaid Other | Admitting: Family Medicine

## 2016-03-31 VITALS — BP 122/72 | HR 92 | Temp 99.0°F | Wt 130.0 lb

## 2016-03-31 DIAGNOSIS — R509 Fever, unspecified: Secondary | ICD-10-CM

## 2016-03-31 DIAGNOSIS — B9789 Other viral agents as the cause of diseases classified elsewhere: Secondary | ICD-10-CM

## 2016-03-31 DIAGNOSIS — J069 Acute upper respiratory infection, unspecified: Secondary | ICD-10-CM

## 2016-03-31 LAB — POCT INFLUENZA A/B
INFLUENZA B, POC: NEGATIVE
Influenza A, POC: NEGATIVE

## 2016-03-31 NOTE — Progress Notes (Signed)
   Subjective:    Patient ID: Meghan Moreno, female    DOB: 01/26/1996, 20 y.o.   MRN: [id number]  HPI  Healthy 20 year old female comes in today with 6 days of dry cough, sneezing, sore throat. She complains some chest soreness from coughing. No shortness of breath. Her cough is more dry. She does complain of feeling achy all over. No ear pain. No GI symptoms. She has had a fever on and off with the highest being 101. She says her brother and parents have been sick with similar illness and they are finally feeling better.    Review of Systems     Objective:   Physical Exam  Constitutional: She is oriented to person, place, and time. She appears well-developed and well-nourished.  HENT:  Head: Normocephalic and atraumatic.  Right Ear: External ear normal.  Left Ear: External ear normal.  Nose: Nose normal.  Mouth/Throat: Oropharynx is clear and moist.  TMs and canals are clear.   Eyes: Conjunctivae and EOM are normal. Pupils are equal, round, and reactive to light.  Neck: Neck supple. No thyromegaly present.  Cardiovascular: Normal rate, regular rhythm and normal heart sounds.   Pulmonary/Chest: Effort normal and breath sounds normal. She has no wheezes.  Lymphadenopathy:    She has no cervical adenopathy.  Neurological: She is alert and oriented to person, place, and time.  Skin: Skin is warm and dry.  Psychiatric: She has a normal mood and affect.       Assessment & Plan:   URI - likely viral. Negative for influenza. Symptomatic care.  Call if not better by end of the work or if still having fever or getting worse.

## 2016-03-31 NOTE — Patient Instructions (Signed)
Call if not better by the end of the week. Call sooner if fever greater than 101 or if getting worse.

## 2016-08-18 ENCOUNTER — Emergency Department
Admission: EM | Admit: 2016-08-18 | Discharge: 2016-08-18 | Discharge status: Absent without leave | Payer: Self-pay | Source: Home / Self Care

## 2017-01-08 IMAGING — US US MFM OB DETAIL+14 WK
1 series · 14 of 28 positions shown · non-contrast
Comparison: none

[Series 1: us mfm ob detail+14 wk · 14 of 99 slices shown]
[im 4/99]
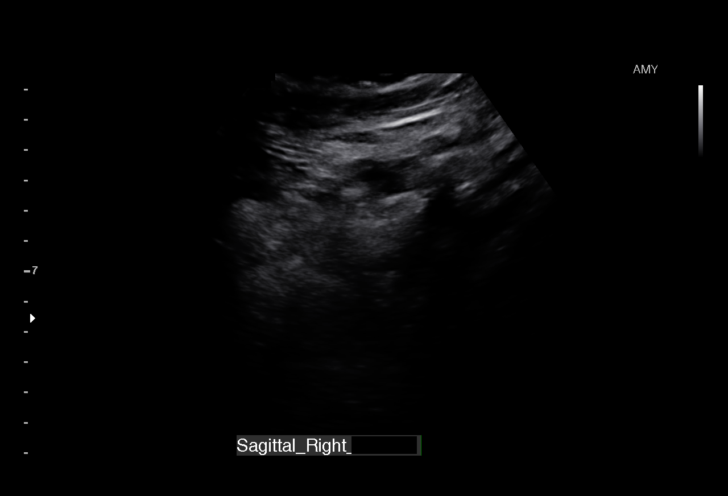
[im 11/99]
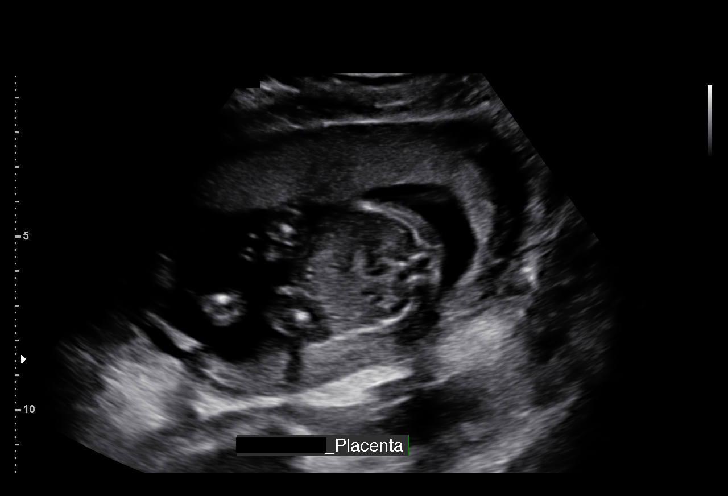
[im 19/99]
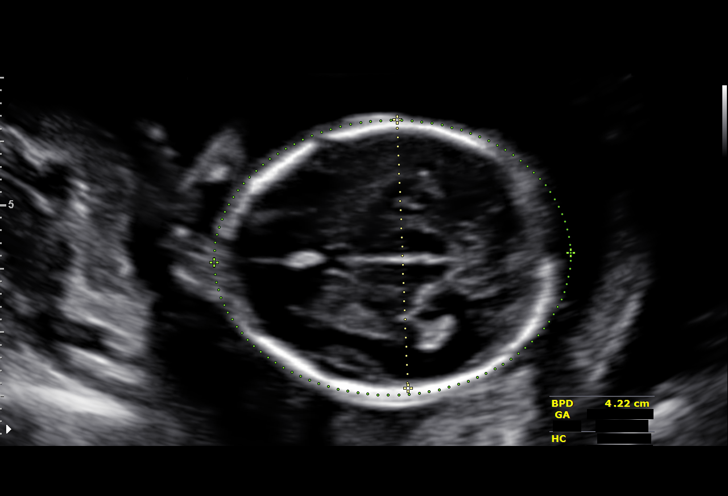
[im 26/99]
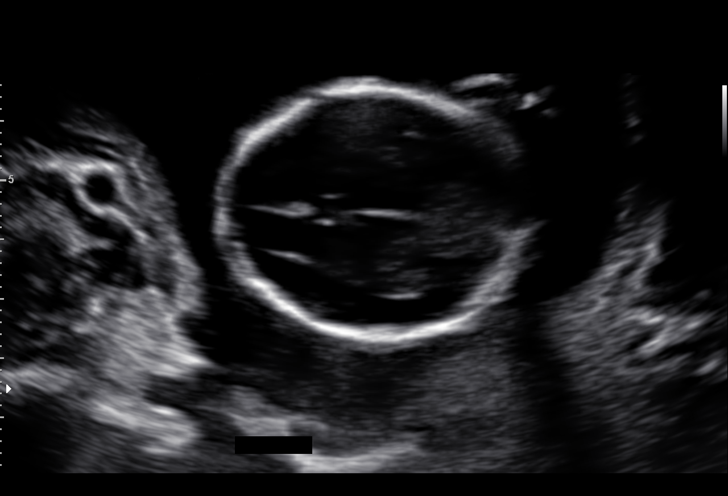
[im 33/99]
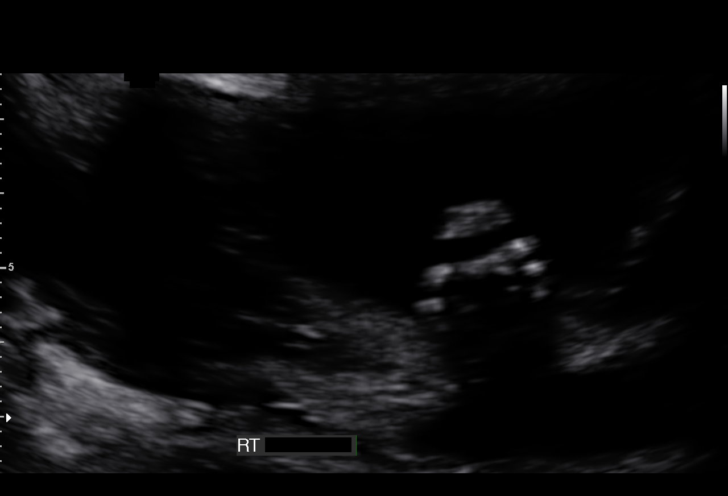
[im 40/99]
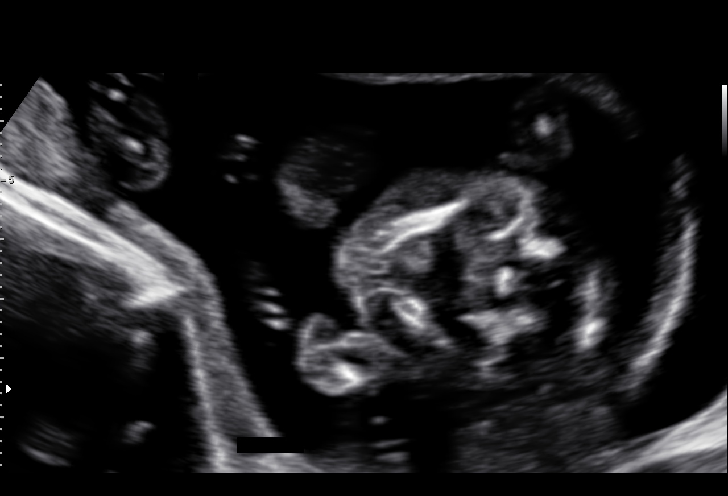
[im 48/99]
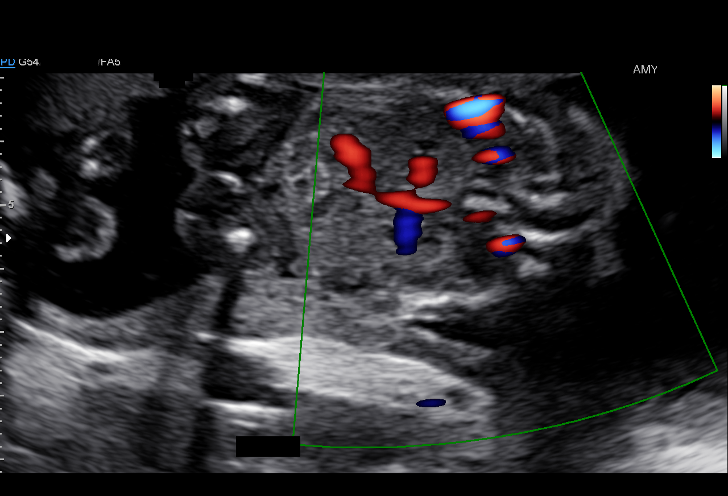
[im 55/99]
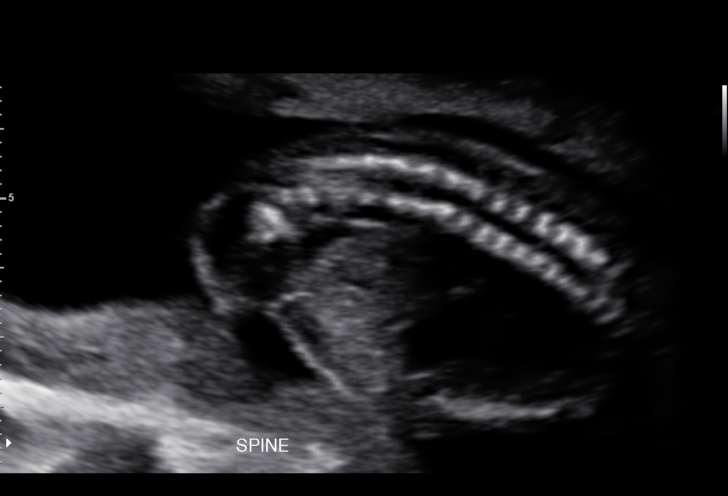
[im 62/99]
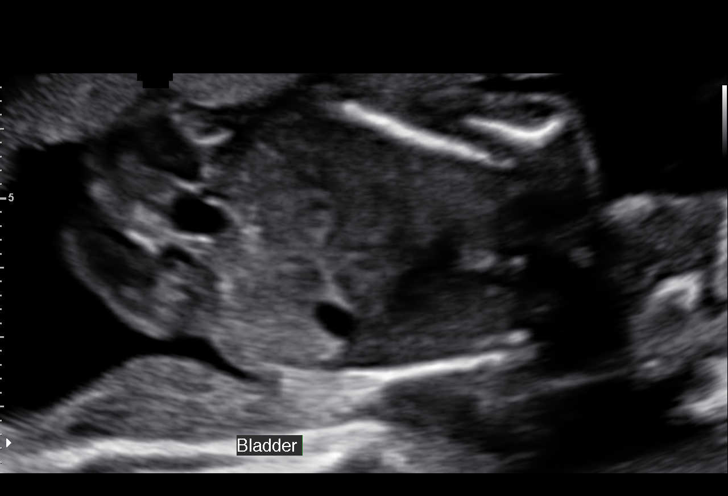
[im 69/99]
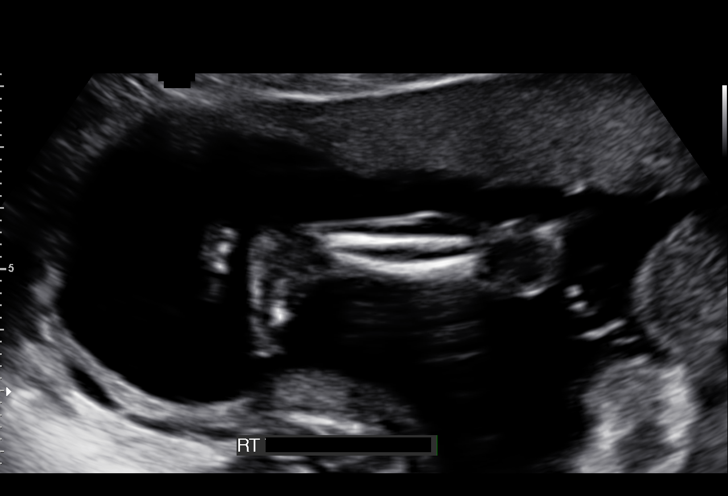
[im 77/99]
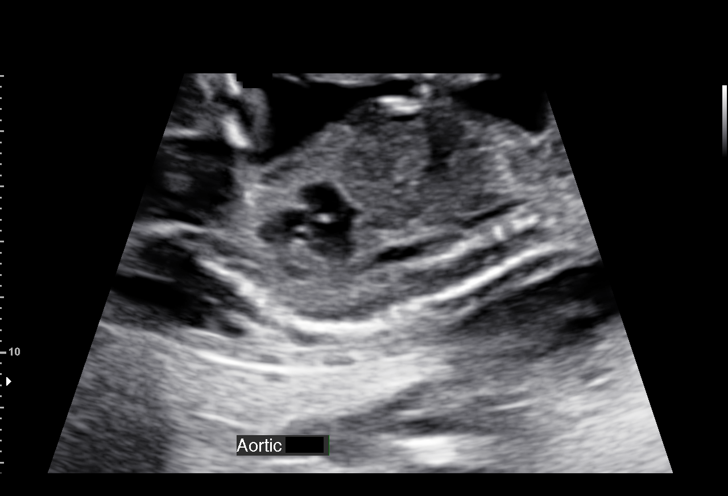
[im 84/99]
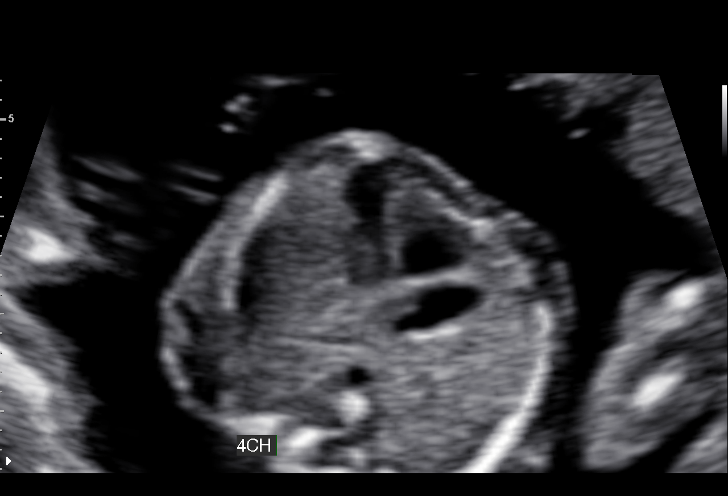
[im 91/99]
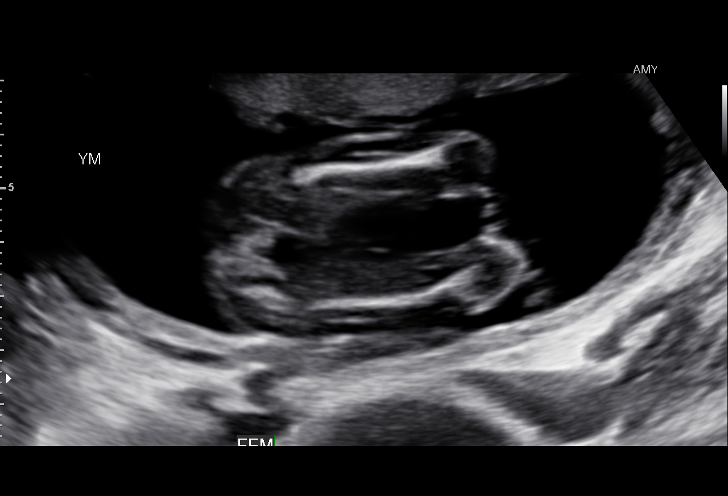
[im 99/99]
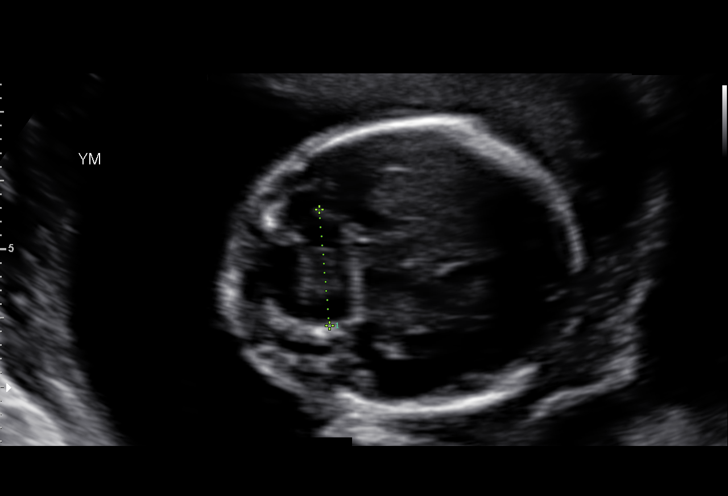

[14 of 28 positions shown; findings below may reference images not displayed]

OBSTETRICS REPORT
(Signed Final 03/08/2015 [DATE])

Service(s) Provided

Indications

Basic anatomic survey                                 Z36
18 weeks gestation of pregnancy
Cystic Fibrosis (CF) Carrier, second trimester
Fetal Evaluation

Num Of Fetuses:    1
Fetal Heart Rate:  138                          bpm
Cardiac Activity:  Observed
Presentation:      Cephalic
Placenta:          Anterior, above cervical os
P. Cord            Visualized
Insertion:

Amniotic Fluid
AFI FV:      Subjectively within normal limits
Larg Pckt:     4.5  cm
Biometry

BPD:     42.1  mm     G. Age:  18w 5d                CI:        72.95   70 - 86
FL/HC:      18.3   16.1 -
18.3
HC:     156.7  mm     G. Age:  18w 5d       43  %    HC/AC:      1.17   1.09 -
1.39
AC:     133.5  mm     G. Age:  18w 6d       55  %    FL/BPD:
FL:      28.6  mm     G. Age:  18w 5d       52  %    FL/AC:      21.4   20 - 24
HUM:     26.3  mm     G. Age:  18w 2d       45  %

Est. FW:     257  gm      0 lb 9 oz     50  %
Gestational Age

LMP:           18w 4d        Date:  10/29/14                 EDD:   08/05/15
U/S Today:     18w 5d                                        EDD:   08/04/15
Best:          18w 4d     Det. By:  LMP  (10/29/14)          EDD:   08/05/15
Anatomy



Other:  Heels and 5th digit appear normal.  Fetus appears to be a female.
Cervix Uterus Adnexa

Cervical Length:    4.2      cm

Cervix:       Normal appearance by transabdominal scan. Appears
closed, without funnelling.
Left Ovary:    Not visualized. No adnexal mass visualized.
Right Ovary:   Within normal limits.
Impression

SIUP at 11w4d, CF carrier
EFW 50th%
no dysmorphic features
no previa
cervix is long and closed
Recommendations

Follow up genetic counseling arranged.

## 2017-12-06 ENCOUNTER — Encounter: Payer: Self-pay | Admitting: Emergency Medicine

## 2017-12-06 ENCOUNTER — Telehealth: Payer: Self-pay | Admitting: Emergency Medicine

## 2017-12-06 ENCOUNTER — Emergency Department (INDEPENDENT_AMBULATORY_CARE_PROVIDER_SITE_OTHER): Payer: BLUE CROSS/BLUE SHIELD

## 2017-12-06 ENCOUNTER — Emergency Department (INDEPENDENT_AMBULATORY_CARE_PROVIDER_SITE_OTHER)
Admission: EM | Admit: 2017-12-06 | Discharge: 2017-12-06 | Disposition: A | Discharge status: Home / Self Care | Payer: BLUE CROSS/BLUE SHIELD | Source: Home / Self Care | Attending: Family Medicine | Admitting: Family Medicine

## 2017-12-06 DIAGNOSIS — O99891 Other specified diseases and conditions complicating pregnancy: Secondary | ICD-10-CM

## 2017-12-06 DIAGNOSIS — Z3A39 39 weeks gestation of pregnancy: Secondary | ICD-10-CM

## 2017-12-06 DIAGNOSIS — Z3493 Encounter for supervision of normal pregnancy, unspecified, third trimester: Secondary | ICD-10-CM

## 2017-12-06 DIAGNOSIS — O9989 Other specified diseases and conditions complicating pregnancy, childbirth and the puerperium: Secondary | ICD-10-CM

## 2017-12-06 DIAGNOSIS — M549 Dorsalgia, unspecified: Secondary | ICD-10-CM

## 2017-12-06 LAB — POCT URINALYSIS DIP (MANUAL ENTRY)
Bilirubin, UA: NEGATIVE
Blood, UA: NEGATIVE
Glucose, UA: NEGATIVE mg/dL
Ketones, POC UA: NEGATIVE mg/dL
Nitrite, UA: NEGATIVE
Protein Ur, POC: NEGATIVE mg/dL
Spec Grav, UA: 1.025 (ref 1.010–1.025)
Urobilinogen, UA: 4 E.U./dL — AB
pH, UA: 6 (ref 5.0–8.0)

## 2017-12-06 LAB — POCT URINE PREGNANCY: Preg Test, Ur: POSITIVE — AB

## 2017-12-06 NOTE — ED Provider Notes (Signed)
[facility]    CSN: [id number] Arrival date & time: 12/06/17  1036     History   Chief Complaint Chief Complaint  Patient presents with  . Back Pain    HPI Kiandra Dannica Bickham is a 22 y.o. female.   HPI  Ivana Nicastro is a 22 y.o. female presenting to UC with c/o Right side lower back pain that is aching and sore.  Pain started about 1 week ago, gradually worsening.  She was seen at [facility] Emergency Department on Saturday, 12/04/17, and had a positive urine pregnancy test, however, she still cannot believe she is pregnant.  She has been on birth control and believes her LMP was over 1 year ago.  She did start a new relationship in February 2019. Denies abdominal pain, urinary symptoms or vaginal symptoms. Denies fever, chills, n/v/d.  Denies noticing her clothes fitting smaller or her abdomen getting larger.    Past Medical History:  Diagnosis Date  . Medical history non-contributory     Patient Active Problem List   Diagnosis Date Noted  . 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 Supervision of normal first pregnancy 12/28/2014  . 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 Knee pain, bilateral 04/26/2013    History reviewed. No pertinent surgical history.  OB History    Gravida  1   Para  1   Term  1   Preterm      AB      Living  1     SAB      TAB      Ectopic      Multiple  0   Live Births  1            Home Medications    Prior to Admission medications   Medication Sig Start Date End Date Taking? Authorizing Provider  norgestrel-ethinyl estradiol (LO/OVRAL,CRYSELLE) 0.3-30 MG-MCG tablet Take 1 tablet by mouth daily. 08/22/15   Allie Bossier, MD    Family History Family History  Problem Relation Age of Onset  . Leukemia Mother   . Diabetes Father   . Stroke Maternal Grandmother        grandparent    Social History Social History   Tobacco Use  . Smoking status: Former Smoker      Packs/day: 0.30    Years: 0.50    Pack years: 0.15    Types: Cigarettes  . Smokeless tobacco: Never Used  Substance Use Topics  . Alcohol use: No    Alcohol/week: 0.0 oz  . Drug use: No     Allergies   Patient has no known allergies.   Review of Systems Review of Systems  Constitutional: Negative for chills and fever.  Gastrointestinal: Negative for abdominal pain, diarrhea, nausea and vomiting.  Genitourinary: Positive for flank pain (Right). Negative for decreased urine volume, dysuria, frequency, hematuria, pelvic pain, urgency, vaginal bleeding, vaginal discharge and vaginal pain.  Musculoskeletal: Positive for back pain (Right side) and myalgias.  Skin: Negative for rash.     Physical Exam Triage Vital Signs ED Triage Vitals [12/06/17 1113]  Enc Vitals Group     BP 121/87     Pulse Rate 100     Resp      Temp 98.7 F (37.1 C)     Temp Source Oral     SpO2 98 %     Weight 147 lb (66.7 kg)     Height  Head Circumference      Peak Flow      Pain Score 0     Pain Loc      Pain Edu?      Excl. in GC?    No data found.  Updated Vital Signs BP 121/87 (BP Location: Right Arm)   Pulse 100   Temp 98.7 F (37.1 C) (Oral)   Wt 147 lb (66.7 kg)   SpO2 98%   BMI 26.04 kg/m   Visual Acuity Right Eye Distance:   Left Eye Distance:   Bilateral Distance:    Right Eye Near:   Left Eye Near:    Bilateral Near:     Physical Exam  Constitutional: She is oriented to person, place, and time. She appears well-developed and well-nourished. No distress.  HENT:  Head: Normocephalic and atraumatic.  Mouth/Throat: Oropharynx is clear and moist.  Eyes: EOM are normal.  Neck: Normal range of motion.  Cardiovascular: Normal rate and regular rhythm.  Pulmonary/Chest: Effort normal and breath sounds normal. No stridor. No respiratory distress. She has no wheezes. She has no rales.  Abdominal: There is no tenderness. There is no CVA tenderness.  Pregnant  appearing abdomen, firm, palpable likely buttock or head of fetus in RUQ   Musculoskeletal: Normal range of motion. She exhibits tenderness.  No midline spinal tenderness. Mild tenderness to Right lower back muscles.  Neurological: She is alert and oriented to person, place, and time.  Skin: Skin is warm and dry. She is not diaphoretic.  Psychiatric: She has a normal mood and affect. Her behavior is normal.  Nursing note and vitals reviewed.    UC Treatments / Results  Labs (all labs ordered are listed, but only abnormal results are displayed) Labs Reviewed  POCT URINE PREGNANCY - Abnormal; Notable for the following components:      Result Value   Preg Test, Ur Positive (*)    All other components within normal limits  POCT URINALYSIS DIP (MANUAL ENTRY) - Abnormal; Notable for the following components:   Clarity, UA cloudy (*)    Urobilinogen, UA 4.0 (*)    Leukocytes, UA Small (1+) (*)    All other components within normal limits    EKG None  Radiology US Ob Limited  Result Date: 12/06/2017 CLINICAL DATA:  Recently found to be pregnant. No previous ultrasound examinations. Patient is on birth control pills. LMP unknown. EXAM: LIMITED OBSTETRIC ULTRASOUND FINDINGS: Number of Fetuses: 1 Heart Rate:  132 bpm Movement: Present Presentation: Cephalic Placental Location: Fundal Previa: None Amniotic Fluid (Subjective):  Within normal limits. BPD: 8.7 cm 35 w  1 d MATERNAL FINDINGS: Cervix:  Limited visualization of the cervix. Uterus/Adnexae: Limited visualization of the ovaries and adnexal structures. IMPRESSION: Viable IUP with estimated gestational age of [redacted] weeks, 1 day. The estimated date of confinement is January 09, 2018. This exam is performed on an emergent basis and does not comprehensively evaluate fetal size, dating, or anatomy; follow-up complete OB US should be considered if further fetal assessment is warranted. Electronically Signed   By: David  [place] M.D.   On: 12/06/2017  12:39    Procedures Procedures (including critical care time)  Medications Ordered in UC Medications - No data to display  Initial Impression / Assessment and Plan / UC Course  I have reviewed the triage vital signs and the nursing notes.  Pertinent labs & imaging results that were available during my care of the patient were reviewed by me and considered in  my medical decision making (see chart for details).     Discussed ultrasound with pt Pt does not appear to be in distress Strongly advised [facility] to [facility] and monitoring. Pt declined EMS transport.    Final Clinical Impressions(s) / UC Diagnoses   Final diagnoses:  Back pain affecting pregnancy  Third trimester pregnancy     Discharge Instructions      [facility] for further evaluation and treatment of your symptoms.  It is estimated that you are [redacted] weeks along in your pregnancy.  Your back pain could be a sign of pre-term labor.  It is very important you area evaluated and monitored by an OB/GYN today.     ED Prescriptions    None     Controlled Substance Prescriptions [place] Controlled Substance Registry consulted? Not Applicable   Rolla Platehelps, Meggan Dhaliwal O, PA-C 12/06/17 1346

## 2017-12-06 NOTE — Discharge Instructions (Signed)
°  [facility] for further evaluation and treatment of your symptoms.  It is estimated that you are [redacted] weeks along in your pregnancy.  Your back pain could be a sign of pre-term labor.  It is very important you area evaluated and monitored by an OB/GYN today.

## 2017-12-06 NOTE — ED Triage Notes (Signed)
Pt c/o back pain that is worse on the right side x1 week. States she had positive pregnancy test on Saturday. Unsure LMP.
